# Patient Record
Sex: Female | Born: 1990 | Race: Black or African American | Hispanic: No | Marital: Single | Smoking: Never smoker
Health system: Southern US, Community
[De-identification: ages and names within clinical notes are randomized; demographics above are authoritative.]

## PROBLEM LIST (undated history)

## (undated) ENCOUNTER — Inpatient Hospital Stay (HOSPITAL_COMMUNITY): Payer: Self-pay

## (undated) DIAGNOSIS — Z87440 Personal history of urinary (tract) infections: Secondary | ICD-10-CM

## (undated) DIAGNOSIS — G5600 Carpal tunnel syndrome, unspecified upper limb: Secondary | ICD-10-CM

## (undated) DIAGNOSIS — D573 Sickle-cell trait: Secondary | ICD-10-CM

## (undated) DIAGNOSIS — K7689 Other specified diseases of liver: Secondary | ICD-10-CM

## (undated) DIAGNOSIS — G43909 Migraine, unspecified, not intractable, without status migrainosus: Secondary | ICD-10-CM

## (undated) DIAGNOSIS — Z87448 Personal history of other diseases of urinary system: Secondary | ICD-10-CM

## (undated) DIAGNOSIS — Z8619 Personal history of other infectious and parasitic diseases: Secondary | ICD-10-CM

## (undated) DIAGNOSIS — K862 Cyst of pancreas: Secondary | ICD-10-CM

## (undated) HISTORY — PX: NO PAST SURGERIES: SHX2092

## (undated) HISTORY — PX: OTHER SURGICAL HISTORY: SHX169

## (undated) HISTORY — PX: WISDOM TOOTH EXTRACTION: SHX21

## (undated) HISTORY — DX: Migraine, unspecified, not intractable, without status migrainosus: G43.909

## (undated) HISTORY — DX: Other specified diseases of liver: K76.89

## (undated) HISTORY — DX: Personal history of urinary (tract) infections: Z87.440

## (undated) HISTORY — DX: Cyst of pancreas: K86.2

## (undated) HISTORY — DX: Personal history of other infectious and parasitic diseases: Z86.19

## (undated) HISTORY — DX: Personal history of other diseases of urinary system: Z87.448

---
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
## 2010-02-07 ENCOUNTER — Emergency Department (HOSPITAL_COMMUNITY): Admission: EM | Admit: 2010-02-07 | Discharge: 2010-02-07 | Payer: Self-pay | Admitting: Emergency Medicine

## 2010-02-15 ENCOUNTER — Emergency Department (HOSPITAL_COMMUNITY): Admission: EM | Admit: 2010-02-15 | Discharge: 2010-02-15 | Payer: Self-pay | Admitting: Emergency Medicine

## 2010-05-12 ENCOUNTER — Emergency Department (HOSPITAL_COMMUNITY): Admission: EM | Admit: 2010-05-12 | Discharge: 2010-05-13 | Payer: Self-pay | Admitting: Emergency Medicine

## 2010-11-07 ENCOUNTER — Emergency Department (HOSPITAL_COMMUNITY)
Admission: EM | Admit: 2010-11-07 | Discharge: 2010-11-08 | Payer: Self-pay | Source: Home / Self Care | Admitting: Emergency Medicine

## 2011-02-15 LAB — CBC
HCT: 40.5 % (ref 36.0–46.0)
Hemoglobin: 13.7 g/dL (ref 12.0–15.0)
MCHC: 33.9 g/dL (ref 30.0–36.0)
MCV: 86.4 fL (ref 78.0–100.0)
Platelets: 239 10*3/uL (ref 150–400)
RBC: 4.69 MIL/uL (ref 3.87–5.11)
RDW: 14.2 % (ref 11.5–15.5)
WBC: 13 10*3/uL — ABNORMAL HIGH (ref 4.0–10.5)

## 2011-02-15 LAB — DIFFERENTIAL
Basophils Absolute: 0.1 10*3/uL (ref 0.0–0.1)
Basophils Relative: 1 % (ref 0–1)
Eosinophils Absolute: 0.1 10*3/uL (ref 0.0–0.7)
Eosinophils Relative: 1 % (ref 0–5)
Lymphocytes Relative: 26 % (ref 12–46)
Lymphs Abs: 3.4 10*3/uL (ref 0.7–4.0)
Monocytes Absolute: 0.9 10*3/uL (ref 0.1–1.0)
Monocytes Relative: 7 % (ref 3–12)
Neutro Abs: 8.5 10*3/uL — ABNORMAL HIGH (ref 1.7–7.7)
Neutrophils Relative %: 65 % (ref 43–77)

## 2011-03-04 LAB — URINALYSIS, ROUTINE W REFLEX MICROSCOPIC
Bilirubin Urine: NEGATIVE
Glucose, UA: NEGATIVE mg/dL
Ketones, ur: NEGATIVE mg/dL
Nitrite: NEGATIVE
Protein, ur: 30 mg/dL — AB
Specific Gravity, Urine: 1.015 (ref 1.005–1.030)
Urobilinogen, UA: 0.2 mg/dL (ref 0.0–1.0)
pH: 6 (ref 5.0–8.0)

## 2011-03-04 LAB — WET PREP, GENITAL
Trich, Wet Prep: NONE SEEN
WBC, Wet Prep HPF POC: NONE SEEN
Yeast Wet Prep HPF POC: NONE SEEN

## 2011-03-04 LAB — GC/CHLAMYDIA PROBE AMP, GENITAL
Chlamydia, DNA Probe: POSITIVE — AB
GC Probe Amp, Genital: NEGATIVE

## 2011-03-04 LAB — POCT PREGNANCY, URINE: Preg Test, Ur: NEGATIVE

## 2011-03-04 LAB — URINE MICROSCOPIC-ADD ON

## 2011-03-08 LAB — CULTURE, ROUTINE-ABSCESS

## 2011-03-08 LAB — DIFFERENTIAL
Basophils Absolute: 0.1 10*3/uL (ref 0.0–0.1)
Basophils Relative: 0 % (ref 0–1)
Eosinophils Absolute: 0.1 10*3/uL (ref 0.0–0.7)
Eosinophils Relative: 1 % (ref 0–5)
Lymphocytes Relative: 11 % — ABNORMAL LOW (ref 12–46)
Lymphs Abs: 1.7 10*3/uL (ref 0.7–4.0)
Monocytes Absolute: 1.6 10*3/uL — ABNORMAL HIGH (ref 0.1–1.0)
Monocytes Relative: 10 % (ref 3–12)
Neutro Abs: 12 10*3/uL — ABNORMAL HIGH (ref 1.7–7.7)
Neutrophils Relative %: 78 % — ABNORMAL HIGH (ref 43–77)

## 2011-03-08 LAB — CBC
HCT: 34.9 % — ABNORMAL LOW (ref 36.0–46.0)
HCT: 35.5 % — ABNORMAL LOW (ref 36.0–46.0)
HCT: 37.8 % (ref 36.0–46.0)
HCT: 38.8 % (ref 36.0–46.0)
Hemoglobin: 11.9 g/dL — ABNORMAL LOW (ref 12.0–15.0)
Hemoglobin: 12 g/dL (ref 12.0–15.0)
Hemoglobin: 12.9 g/dL (ref 12.0–15.0)
Hemoglobin: 13.2 g/dL (ref 12.0–15.0)
MCHC: 33.7 g/dL (ref 30.0–36.0)
MCHC: 34 g/dL (ref 30.0–36.0)
MCHC: 34 g/dL (ref 30.0–36.0)
MCHC: 34.2 g/dL (ref 30.0–36.0)
MCV: 84.4 fL (ref 78.0–100.0)
MCV: 84.7 fL (ref 78.0–100.0)
MCV: 84.9 fL (ref 78.0–100.0)
MCV: 85.2 fL (ref 78.0–100.0)
Platelets: 229 10*3/uL (ref 150–400)
Platelets: 251 10*3/uL (ref 150–400)
Platelets: 277 10*3/uL (ref 150–400)
RBC: 4.1 MIL/uL (ref 3.87–5.11)
RBC: 4.19 MIL/uL (ref 3.87–5.11)
RBC: 4.47 MIL/uL (ref 3.87–5.11)
RBC: 4.57 MIL/uL (ref 3.87–5.11)
RDW: 13.9 % (ref 11.5–15.5)
RDW: 14.3 % (ref 11.5–15.5)
RDW: 14.3 % (ref 11.5–15.5)
WBC: 10.5 10*3/uL (ref 4.0–10.5)
WBC: 15.4 10*3/uL — ABNORMAL HIGH (ref 4.0–10.5)
WBC: 16.4 10*3/uL — ABNORMAL HIGH (ref 4.0–10.5)
WBC: 9 10*3/uL (ref 4.0–10.5)

## 2011-03-08 LAB — BASIC METABOLIC PANEL
BUN: 3 mg/dL — ABNORMAL LOW (ref 6–23)
BUN: 3 mg/dL — ABNORMAL LOW (ref 6–23)
CO2: 21 mEq/L (ref 19–32)
CO2: 22 mEq/L (ref 19–32)
CO2: 25 mEq/L (ref 19–32)
Calcium: 8.9 mg/dL (ref 8.4–10.5)
Calcium: 8.9 mg/dL (ref 8.4–10.5)
Chloride: 105 mEq/L (ref 96–112)
Chloride: 106 mEq/L (ref 96–112)
Chloride: 107 mEq/L (ref 96–112)
Creatinine, Ser: 0.66 mg/dL (ref 0.4–1.2)
Creatinine, Ser: 0.78 mg/dL (ref 0.4–1.2)
GFR calc Af Amer: >60 mL/min (ref >60)
GFR calc Af Amer: >60 mL/min (ref >60)
GFR calc Af Amer: >60 mL/min (ref >60)
GFR calc non Af Amer: >60 mL/min (ref >60)
GFR calc non Af Amer: >60 mL/min (ref >60)
Glucose, Bld: 110 mg/dL — ABNORMAL HIGH (ref 70–99)
Glucose, Bld: 94 mg/dL (ref 70–99)
Potassium: 3.5 mEq/L (ref 3.5–5.1)
Potassium: 4 mEq/L (ref 3.5–5.1)
Potassium: 4.2 mEq/L (ref 3.5–5.1)
Sodium: 134 mEq/L — ABNORMAL LOW (ref 135–145)
Sodium: 136 mEq/L (ref 135–145)

## 2011-03-08 LAB — CULTURE, BLOOD (ROUTINE X 2)
Culture: NO GROWTH
Culture: NO GROWTH

## 2011-03-08 LAB — ANAEROBIC CULTURE

## 2011-03-08 LAB — TSH: TSH: 0.823 u[IU]/mL (ref 0.350–4.500)

## 2011-03-08 LAB — COMPREHENSIVE METABOLIC PANEL
ALT: 14 U/L (ref 0–35)
AST: 21 U/L (ref 0–37)
Albumin: 2.6 g/dL — ABNORMAL LOW (ref 3.5–5.2)
CO2: 23 mEq/L (ref 19–32)
Calcium: 8.4 mg/dL (ref 8.4–10.5)
Chloride: 109 mEq/L (ref 96–112)
Creatinine, Ser: 0.67 mg/dL (ref 0.4–1.2)
GFR calc Af Amer: >60 mL/min (ref >60)
Sodium: 137 mEq/L (ref 135–145)

## 2011-03-15 LAB — CULTURE, ROUTINE-ABSCESS

## 2011-03-15 LAB — POCT I-STAT 7, (LYTES, BLD GAS, ICA,H+H)
Acid-Base Excess: 8 mmol/L — ABNORMAL HIGH (ref 0.0–2.0)
Bicarbonate: 33.3 mEq/L — ABNORMAL HIGH (ref 20.0–24.0)
HCT: 34 % — ABNORMAL LOW (ref 36.0–49.0)
Potassium: 4.4 mEq/L (ref 3.5–5.1)
Sodium: 139 mEq/L (ref 135–145)
TCO2: 35 mmol/L (ref 0–100)

## 2011-03-15 LAB — DIFFERENTIAL
Basophils Relative: 0 % (ref 0–1)
Eosinophils Absolute: 0.1 10*3/uL (ref 0.0–1.2)
Eosinophils Relative: 1 % (ref 0–5)
Lymphs Abs: 1.2 10*3/uL (ref 1.1–4.8)

## 2011-03-15 LAB — CBC
HCT: 39.5 % (ref 36.0–49.0)
MCHC: 32.8 g/dL (ref 31.0–37.0)
MCV: 85.1 fL (ref 78.0–98.0)
Platelets: 260 10*3/uL (ref 150–400)
WBC: 13.2 10*3/uL (ref 4.5–13.5)

## 2011-03-15 LAB — GLUCOSE, CAPILLARY: Glucose-Capillary: 111 mg/dL — ABNORMAL HIGH (ref 70–99)

## 2011-03-15 LAB — CULTURE, BLOOD (ROUTINE X 2)

## 2011-04-06 ENCOUNTER — Emergency Department (HOSPITAL_COMMUNITY)
Admission: EM | Admit: 2011-04-06 | Discharge: 2011-04-07 | Disposition: A | Discharge status: Home / Self Care | Payer: Medicaid Other | Attending: Emergency Medicine | Admitting: Emergency Medicine

## 2011-04-06 DIAGNOSIS — S93609A Unspecified sprain of unspecified foot, initial encounter: Secondary | ICD-10-CM | POA: Insufficient documentation

## 2011-04-06 DIAGNOSIS — D573 Sickle-cell trait: Secondary | ICD-10-CM | POA: Insufficient documentation

## 2011-04-06 DIAGNOSIS — M79609 Pain in unspecified limb: Secondary | ICD-10-CM | POA: Insufficient documentation

## 2011-04-06 DIAGNOSIS — X58XXXA Exposure to other specified factors, initial encounter: Secondary | ICD-10-CM | POA: Insufficient documentation

## 2011-04-13 NOTE — Consult Note (Signed)
NAMEKENSLEE, ACHORN NO.:  [id number]   MEDICAL RECORD NO.:  [id number]          PATIENT TYPE:  INP   LOCATION:  6729                         FACILITY:  [facility]   PHYSICIAN:  Maisie Fus A. Cornett, M.D.DATE OF BIRTH:  02-14-1991   DATE OF CONSULTATION:  05/17/2009  DATE OF DISCHARGE:                                 CONSULTATION   PHYSICIAN REQUESTING CONSULTATION:  Dr. Lovell Sheehan.   REASON FOR CONSULTATION:  Right hip and right abdominal wall abscess.   HISTORY OF PRESENT ILLNESS:  The patient is a 20 year old female with a  past history of MRSA, cellulitis, and abscess status post drainage of  the left thigh back in this January of 2010.  She is morbidly obese.  She presents with 2-day history of swelling involving the right hip and  right lower abdominal wall under the anterior superior iliac spine x2  days.  She was placed on a few antibiotics by an outside physician but  came to the hospital since it did not seem to help.  I was asked to see  her at the request of Dr. Lovell Sheehan due to an abscess, took a CT scan  since ultrasound of the area did not show abscess by the emergency room  doctor.  Denies any fever or chills.  She complains of pain over the  area especially with palpation.   PAST MEDICAL HISTORY:  1. Morbid obesity.  2. MRSA infection in the past.  3. Migraines.   PAST SURGICAL HISTORY:  Incision and drainage of previous abscesses.   SOCIAL HISTORY:  She is a Consulting civil engineer.  Denies tobacco or alcohol use.   ALLERGIES:  No known drug allergies.   FAMILY HISTORY:  Noncontributory.   REVIEW OF SYSTEMS:  Positive for right flank discomfort especially just  below her right anterior superior iliac spine, redness and fluctuance.  Otherwise 15-point review of systems negative.   PHYSICAL EXAMINATION:  GENERAL APPEARANCE:  Pleasant female, in no  apparent distress.  HEENT:  No evidence of scleral icterus.  Oropharynx is clear.  SKIN:  Just below her right  anterior superior iliac spine, there is an  area of induration measuring about 20 cm.  Centrally, there is an area  of fluctuance noted on exam.  There is some small micro abscesses of the  skin noted there.  There is no crepitus.   DIAGNOSTIC STUDIES:  White count 15,400, hemoglobin 12.9, sodium 136,  potassium 3.5, chloride 107, CO2 of 22, creatinine is 0.7, and glucose  110.   IMPRESSION:  Right abdominal wall, right hip superficial abscess, noted  by CT with no deep extension with history of methicillin-resistant  Staphylococcus aureus.   PLAN:  She will be made n.p.o. and we will make arrangements for her to  go to the operating room later on this morning for incision and drainage  under general anesthesia.  This will need to be packed.  She will also  continue IV antibiotics as p.o. antibiotics have been ineffective.  It  is being managed by the medical service.      Thomas A. Cornett, M.D.  Electronically Signed  TAC/MEDQ  D:  05/17/2009  T:  05/17/2009  Job:  [id number]

## 2011-04-13 NOTE — Consult Note (Signed)
Anne Ray, NIER NO.:  [id number]   MEDICAL RECORD NO.:  [id number]          PATIENT TYPE:  INP   LOCATION:  6153                         FACILITY:  [facility]   PHYSICIAN:  Juanetta Gosling, MDDATE OF BIRTH:  18-Mar-1991   DATE OF CONSULTATION:  12/12/2008  DATE OF DISCHARGE:                                 CONSULTATION   REQUESTING PHYSICIAN:  Henrietta Hoover, M.D.   REASON FOR CONSULTATION:  Evaluation of draining abscess, left thigh.   HISTORY OF PRESENT ILLNESS:  Anne Ray is a 20 year old, otherwise  relatively healthy female patient who presents with her first episode of  MRSA abscess.  The patient reports onset of this area on December 06, 2008, started as a small pimple area very tender rapidly increased in  size.  She was seen in Urgent Care.  Within 24 hours, this area was I  and D'd, and the patient continued to have swelling and pain, so she  presented to the [facility] ER where she was admitted by the Pediatric  Service.  Cultures were obtained and these did show MRSA sensitive to  clindamycin.  She has been placed on p.o. clindamycin with apparent  improvement in her symptoms.  Her white count on December 07, 2008, was  13,000.  An MRI was done because of persistent residual edema and pain.  This was done on December 11, 2008, that showed no evidence of residual  abscess or fluid collection.  No myositis or fasciitis, just a very  large phlegmon in the muscle tissue.  She did have lymph node  enlargement.  The patient has had purulent to serous drainage since  admission and the packing that was placed has fallen out.  She also has  edema of the thigh region, but the patient reports this has markedly  decreased since prior to admission.   REVIEW OF SYSTEMS:  CONSTITUTIONAL:  The patient reports that her pain  and otherwise overall symptoms related to the leg are much better than  prior to admission.  She is still having some areas of significant pain  in the area adjacent to the wound that correlates with an enlarged lymph  node.   FAMILY MEDICAL HISTORY:  Noncontributory.   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   ALLERGIES:  NKDA.   MEDICATIONS:  While hospitalized include MiraLax, clindamycin, Colace,  Zofran, ibuprofen, Tylenol, and oxycodone p.r.n.   PAST MEDICAL HISTORY:  1. Migraines.  2. Obesity.   PAST SURGICAL HISTORY:  None.   PHYSICAL EXAMINATION:  GENERAL:  Pleasant female patient complaining of  continued pain at the left I and D abscess site.  VITAL SIGNS:  Temperature 97, BP 120/69, pulse 82 and regular, and  respirations 18.  PSYCH:  The patient is alert and oriented x3.  Affect is appropriate to  current situation.  NEURO:  Cranial nerves II-XII are grossly intact.  She is moving all  extremities x4 without focal neurological deficits.  CHEST:  Bilateral lung sounds are clear to auscultation anteriorly.  Respiratory effort is nonlabored.  She is on  room air.  CARDIOVASCULAR:  Heart sounds S1 and S2.  No rubs, murmurs, thrills, or  gallops.  No JVD.  No peripheral edema.  Pulse is regular and  nontachycardic.  ABDOMEN:  Soft, nontender, nondistended, and obese.  EXTREMITIES:  Symmetrical in appearance except for the previously  mentioned edema of the left thigh.  This edema is notable, but is not  tight.  SKIN:  There is a 1-cm diameter I and D procedure wound on the anterior  portion of the thigh.  This is draining serous fluid.  There is no  packing in place at the present time.  This was removed this morning.  The peri-wound tissue is essentially soft with only slight soft tissue  induration beneath.  As you move away from the I and D site, there is  subtle increase in induration.  There is a palpable lymph node at about  9 o'clock, 4 cm away from an I and D site.  This lymph node is quite  tender.  The lymph node is firm and nonmobile.   LABORATORY DATA:   Blood cultures x1 are pending.  Wound culture again  showed MRSA with a clindamycin sensitivity, MIC being 0.25.  Last CBC  was checked on December 07, 2008.  White count was 13,200, hemoglobin  12.9, platelets 260,000, and neutrophils 79%.   DIAGNOSTICS:  Left hip and lower extremity MRI was done on December 11, 2008, again demonstrates subcu soft tissue infection with associated  edema without fasciitis or myositis, but there is a large phlegmon  without any definite fluid collection or residual abscess.  There is  also lymphadenopathy in the inguinal regions.   IMPRESSION:  Draining methicillin-resistant Staphylococcus aureus  abscesses, status post incision and drainage, stable.   PLAN:  1. We will check a CBC.  If the white count is actually increased,      would favor possibly changing over to IV vancomycin to ensure      adequate treatment of acute issues.  Otherwise, would continue      clindamycin.  2. Begin hydrotherapy with physical therapy at least x1 treatment to      determine if there is any residual debris in the abscess tract that      could be pulled out especially given her degree of edema.  3. We will go ahead and start normal saline packing b.i.d. with      quarter-inch Nu-Gauze strips.  Use a Q-tip to pack it deeply if      possible.  The patient will probably need to continue this after      discharge to ensure that the abscess tract stays open and all the      edema can drain out through the abscess tract to improve wound      healing.  I do not think there is any other area that is need of drainage.  The  area has decreased significantly and appears to have only residual  induration.      Allison L. Marya Landry, MD  Electronically Signed    ALE/MEDQ  D:  12/12/2008  T:  12/13/2008  Job:  [id number]   cc:   Henrietta Hoover, MD

## 2011-04-13 NOTE — Discharge Summary (Signed)
NAMEKEONIA, PASKO NO.:  [id number]   MEDICAL RECORD NO.:  [id number]          PATIENT TYPE:  INP   LOCATION:  6729                         FACILITY:  [facility]   PHYSICIAN:  Beckey Rutter, MD  DATE OF BIRTH:  November 03, 1991   DATE OF ADMISSION:  05/16/2009  DATE OF DISCHARGE:  05/19/2009                               DISCHARGE SUMMARY   PRIMARY CARE PHYSICIAN:  Unassigned.   BRIEF HISTORY OF PRESENT ILLNESS AND HOSPITAL COURSE:  1. Worsening right hip area abscess.  This is 20 years old presents      with above-mentioned complaint.  The patient has I and D done by      General Surgery.  The patient was found to have MRSA.  Today, she      is stable for discharge to continue on 10-14 days of doxycycline as      prescribed.  She is aware and agreeable to discharge plan.  2. Obesity.  The patient counseled.   DISCHARGE DIAGNOSES:  1. Right iliac crest abscess with cellulitis.  2. Leukocytosis.  3. Sickle cell trait.  4. Obesity.  5. Methicillin-resistant Staphylococcus aureus from the abscess.   DISCHARGE MEDICATION:  1. Doxycycline 100 mg p.o. b.i.d.  2. Sarna Lotion.   DISCHARGE/PLAN:  The patient was advised to follow up with [facility] within 2 weeks.   HOSPITAL CONSULTATION:  Surgical Service provided by [facility].  The patient to see Dr. Corliss Skains.      Beckey Rutter, MD  Electronically Signed     EME/MEDQ  D:  05/19/2009  T:  05/20/2009  Job:  [id number]

## 2011-04-13 NOTE — Op Note (Signed)
NAMEAMARIANA, [name] NO.:  [id number]   MEDICAL RECORD NO.:  [id number]          PATIENT TYPE:  INP   LOCATION:  6729                         FACILITY:  [facility]   PHYSICIAN:  Wilmon Arms. Corliss Skains, M.D. DATE OF BIRTH:  Aug 25, 1991   DATE OF PROCEDURE:  05/17/2009  DATE OF DISCHARGE:                               OPERATIVE REPORT   PREOPERATIVE DIAGNOSIS:  Abscess of the soft tissues of the right hip  and thigh.   POSTOPERATIVE DIAGNOSIS:  Abscess of the soft tissues of the right hip  and thigh.   PROCEDURE PERFORMED:  Incision and drainage, and debridement a right  hip/thigh abscess.   SURGEON:  Wilmon Arms. Tsuei, MD   ANESTHESIA:  General.   INDICATIONS:  This is an 20 year old female who is morbidly obese with a  past medical history of MRSA infections who presents with a 2-day  history of right hip pain, induration, swelling.  She has a 20-cm area  of induration.  A CT scan showed a loculated abscess superficial to her  anterior superior iliac spine.  The abscess has begun spontaneously  draining through a small pinhole.  She was brought to the operating room  for formal I and D.   DESCRIPTION OF PROCEDURE:  The patient was brought to the operating  room, placed in supine position on the operating room table.  After an  adequate level of general anesthesia was obtained, her right hip and  thigh were prepped with Betadine and draped in sterile fashion.  A time-  out was taken to assure the proper patient, proper procedure.  I  inserted a hemostat into the pinhole opening and opened it widely.  A  large amount of purulent fluid was expressed.  We cultured this and sent  it to Microbiology.  I then created a circular opening over this area  large enough to allow the passage of a finger.  We suctioned out as much  purulent fluid as possible.  I then explored the wound with a finger.  This seemed to track laterally and medially, but not superiorly or  inferiorly.   There were several loculations were broken up with a  finger.  This seemed to track anterior on the thigh, so I made an  elongated oval incision to expose the entire abscess cavity.  We  debrided to allow the necrotic tissue.  Pulse lavage was then used with  3 L of saline to thoroughly irrigate the entire abscess cavity.  We then  used cautery for hemostasis.  The wound was packed with saline-soaked  Kerlix.  An ABD dressing was used to cover the wound.  The patient was  extubated, brought to recovery in stable condition.  All sponge,  instrument, and needle counts were correct.      Wilmon Arms. Tsuei, M.D.  Electronically Signed     MKT/MEDQ  D:  05/17/2009  T:  05/18/2009  Job:  [id number]

## 2011-04-13 NOTE — H&P (Signed)
NAMECHRISTELLA, APP NO.:  [id number]   MEDICAL RECORD NO.:  [id number]          PATIENT TYPE:  INP   LOCATION:  6729                         FACILITY:  [facility]   PHYSICIAN:  Della Goo, M.D. DATE OF BIRTH:  [birth year]   DATE OF ADMISSION:  05/16/2009  DATE OF DISCHARGE:                              HISTORY & PHYSICAL   PRIMARY CARE PHYSICIAN:  [facility].   CHIEF COMPLAINT:  Worsening right hip area abscess.   HISTORY OF PRESENT ILLNESS:  This is a 20 year old female who presents  to the emergency department with complaints of worsening of an abscess  above her right hip area.  The patient reports this pain, swelling and  redness began 1 week ago.  She states that she thought it was a mosquito  bite and she was told initially that it may have been an allergic  reaction to the mosquito bite.  The patient reports being seen by her  primary care physician and placed on antibiotic therapy of Bactrim 4  days ago.  However, the area continued to increase in size and pain and  redness.  The patient denies having any drainage from the area.  The  patient denies having any fevers, chills, however was found to have a  temperature of 100.5 at the initial intake.   PAST MEDICAL HISTORY:  Significant for sickle cell trait, migraine  headaches and Depo-Provera q. 3 months.   ALLERGIES:  No known drug allergies.   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  She is a  nonsmoker, nondrinker, and no history of illicit drug usage.   FAMILY HISTORY:  Positive for diabetes in her maternal grandmother.  No  history of hypertension, coronary artery disease or cancer in her family  that she knows of.   REVIEW OF SYSTEMS:  Other than what is mentioned above, the patient  denies having any nausea, vomiting, diarrhea, fever, chills, chest pain,  shortness of breath, syncope, seizures, weight gain, weight loss, loss  of appetite, arthralgias,  dysuria.   PHYSICAL EXAMINATION FINDINGS:  This is a 20 year old obese female in  discomfort but no acute distress.  Her temperature is 100.5, blood pressure 125/72, heart rate 118,  respirations 18, O2 saturation is 99-100%.  HEENT EXAMINATION:  Normocephalic, atraumatic.  There is no scleral  icterus.  Pupils equally round, reactive to light.  Extraocular  movements are intact.  Funduscopic benign.  Nares are patent  bilaterally.  Oropharynx is clear.  NECK:  Supple, full range of motion.  No thyromegaly, adenopathy or  jugulovenous distention.  CARDIOVASCULAR:  Tachycardiac rate and rhythm.  No murmurs, gallops or  rubs.  LUNGS:  Clear to auscultation bilaterally.  No rales, rhonchi or wheezes  appreciated.  ABDOMEN:  Positive bowel sounds, soft, nontender, nondistended.  SKIN EXAMINATION:  The patient has a large, oval-shaped indurated area  which has been measured and is found to have dimensions of 25 x 14 cm.  There is a central area which appears to be coming to a head.  There is  no punctum at this area.  This  area is located at the right superior  iliac crest area.  EXTREMITIES:  Without cyanosis, clubbing or edema.  NEUROLOGIC EXAMINATION:  The patient is alert and oriented x3.  Cranial  nerves are intact.  Motor and sensory function intact.  Gait is intact  and deep tendon reflexes are also intact.  Cerebellar function intact.   LABORATORY STUDIES:  White blood cell count 15.4, hemoglobin 12.9,  hematocrit 37.8, MCV 84.4, platelets 251.  Neutrophils 78%, lymphocytes  11%.  Sodium 136, potassium 3.5, chloride 107, carbon dioxide 22, BUN 3,  creatinine 0.78, glucose 110, calcium 8.9.  CT scan of the abdomen and  pelvis reveals cellulitis in the subcutaneous tissues of the right side  of the abdomen laterally just above the right superior iliac crest, and  the CT portion of the pelvis reveals an abscess in the subcutaneous  tissues above the right hip at approximately the  level of the right  superior iliac crest.  The irregular abscess has dimensions about 5 cm  in diameter, but there are several small pockets of pus with overlying  cellulitis.  There is no deep extension into the muscles or into the  bony structures of the hip joint.   ASSESSMENT:  An 20 year old female being admitted with:  1. Abscess of the right iliac crest with overlying cellulitis.  2. Leukocytosis.  3. Febrile illness/early sepsis.  4. Sickle cell trait.  5. Obesity.   PLAN:  The patient will be admitted and placed on IV antibiotic therapy  of vancomycin and Unasyn.  The patient will be placed on MRSA  precautions.  Pain control therapy has also been ordered.  A general  surgery consultation has been placed with Dr. Luisa Hart, who will see the  patient in the a.m. for a possible incision and drainage.  DVT and GI  prophylaxis have also been ordered.      Della Goo, M.D.  Electronically Signed     HJ/MEDQ  D:  05/17/2009  T:  05/17/2009  Job:  [id number]

## 2011-04-13 NOTE — Discharge Summary (Signed)
NAMEWELDA, [name]:  [id number]   MEDICAL RECORD NO.:  [id number]          PATIENT TYPE:  INP   LOCATION:  6153                         FACILITY:  [facility]   PHYSICIAN:  Orie Rout, M.D.DATE OF BIRTH:  09-09-1991   DATE OF ADMISSION:  12/07/2008  DATE OF DISCHARGE:  12/13/2008                               DISCHARGE SUMMARY   SIGNIFICANT FINDINGS:  This is a 20 year old female who presents with  left lateral thigh cellulitis, status post Incision and Drainage at the  Urgent Care Center on December 06, 2008, and sent home with Septra.  The  patient became nauseous with the medication and Septra was switched to  doxycycline.  On admission, the patient was afebrile.  CBC with white  blood cell count of 13.2k, hemoglobin 12.9gm/dL, hematocrit 16.1%,  platelet 260k with 79% neutrophils and an ANC of 10.4k.  Culture of the  abscess was obtained which grew CA-MRSA.  Blood culture was obtained  which has been no growth to date.  The patient was started on  clindamycin IV at  admission.  When the patient had remained afebrile on  clindamycin IV she was transitioned to oral clindamycin. And again she  has been afebrile from admission to day of discharge.  An MRI was  ordered on December 11, 2008, to rule out pyomyositis and fasciitis.  The  MRI showed subcutaneous tissue infection without fasciitis or myositis  or osteomyelitis.  General Surgery was then consulted to evaluate the  patient for possible I and D.  Surgery saw the patient and recommended  that she has hydrofusion therapy daily with normal saline packing.  Surgery also recommended wound care and home health care.  Case manager  has been able to set up home health for patient for wound care with  daily packing.  Surgery was again contacted for followup care and  Surgery has decided that the patient does not need to follow up with  them since they were not the one who did the original I and D.  Since  the  patient has remained afebrile during this hospitalization and her  abscess has been draining and decreasing in size the decision was made  the patient was stable to be discharged.  The patient is discharged on  December 13, 2008, to continue with oral clindamycin for a total of 10  days on clindamycin.   During this hospitalization, the patient also complained of leg numbness  that has been present for years.  Since the patient had a family history  of diabetes, we were concerned that her numbness may be due to diabetes  and we obtained random CBGs, which were 104 and 111.  Diabetes was ruled  out, but patient is obese and may be prediabetic.  She should follow-up  with her PCP regarding obesity.  Nutrition was also consulted to offer  patient healthy food options but she expressed that she was not  interested in losing weight.   TREATMENT:  1. Clindamycin IV q.8 h.  2. Tylenol with codeine for pain.  3. Oxycodone 5 mg q.6 h. p.r.n. pain.  4. Clindamycin p.o.  OPERATIONS AND PROCEDURES:  MRI on December 11, 2008, which showed left  thigh subcutaneous soft tissue infection, negative osteomyelitis,  negative fasciitis, and negative myositis.   FINAL DIAGNOSES:  1. Methicillin-resistant Staphylococcus aureus cellulitis/abscess to      Left lateral thigh.  2. Obesity.   DISCHARGE MEDICATIONS:  1. Clindamycin 600 mg p.o. q.8 h. x5 days.  2. Oxycodone 5 mg p.o. q.6 h. p.r.n. severe pain x10 tablets.   PENDING RESULTS TO BE FOLLOWED UP:  Blood culture.   FOLLOWUP VISIT:  The patient has a followup visit with [facility], [place], which is her PCPs office, phone number [phone].  The patient has an appointment with Galvin Proffer, PCP, on December 19, 2008, at 11 o'clock.  Patient to have home health visits to the house  for wound care.   DISCHARGE WEIGHT:  138 kg.   DISCHARGE CONDITION:  Stable.   Fax to primary care physician at [phone].      Angeline Slim, MD   Electronically Signed      Orie Rout, M.D.  Electronically Signed    CT/MEDQ  D:  12/13/2008  T:  12/14/2008  Job:  [id number]

## 2011-05-07 ENCOUNTER — Emergency Department (HOSPITAL_COMMUNITY)
Admission: EM | Admit: 2011-05-07 | Discharge: 2011-05-08 | Disposition: A | Discharge status: Home / Self Care | Payer: Medicaid Other | Attending: Emergency Medicine | Admitting: Emergency Medicine

## 2011-05-07 DIAGNOSIS — L02419 Cutaneous abscess of limb, unspecified: Secondary | ICD-10-CM | POA: Insufficient documentation

## 2011-05-07 DIAGNOSIS — L989 Disorder of the skin and subcutaneous tissue, unspecified: Secondary | ICD-10-CM | POA: Insufficient documentation

## 2011-05-07 DIAGNOSIS — R000 Tachycardia, unspecified: Secondary | ICD-10-CM | POA: Insufficient documentation

## 2011-05-07 DIAGNOSIS — D573 Sickle-cell trait: Secondary | ICD-10-CM | POA: Insufficient documentation

## 2011-05-07 DIAGNOSIS — M79609 Pain in unspecified limb: Secondary | ICD-10-CM | POA: Insufficient documentation

## 2011-05-07 DIAGNOSIS — M7989 Other specified soft tissue disorders: Secondary | ICD-10-CM | POA: Insufficient documentation

## 2011-05-07 DIAGNOSIS — E669 Obesity, unspecified: Secondary | ICD-10-CM | POA: Insufficient documentation

## 2011-05-09 ENCOUNTER — Emergency Department (HOSPITAL_COMMUNITY)
Admission: EM | Admit: 2011-05-09 | Discharge: 2011-05-10 | Disposition: A | Discharge status: Home / Self Care | Payer: Medicaid Other | Attending: Emergency Medicine | Admitting: Emergency Medicine

## 2011-05-09 DIAGNOSIS — L02419 Cutaneous abscess of limb, unspecified: Secondary | ICD-10-CM | POA: Insufficient documentation

## 2011-05-09 DIAGNOSIS — L03119 Cellulitis of unspecified part of limb: Secondary | ICD-10-CM | POA: Insufficient documentation

## 2011-05-09 DIAGNOSIS — Z4801 Encounter for change or removal of surgical wound dressing: Secondary | ICD-10-CM | POA: Insufficient documentation

## 2011-05-12 ENCOUNTER — Emergency Department (HOSPITAL_COMMUNITY)
Admission: EM | Admit: 2011-05-12 | Discharge: 2011-05-12 | Disposition: A | Discharge status: Home / Self Care | Payer: Medicaid Other | Attending: Emergency Medicine | Admitting: Emergency Medicine

## 2011-05-12 DIAGNOSIS — L02419 Cutaneous abscess of limb, unspecified: Secondary | ICD-10-CM | POA: Insufficient documentation

## 2011-05-12 DIAGNOSIS — L03119 Cellulitis of unspecified part of limb: Secondary | ICD-10-CM | POA: Insufficient documentation

## 2011-05-12 DIAGNOSIS — Z4801 Encounter for change or removal of surgical wound dressing: Secondary | ICD-10-CM | POA: Insufficient documentation

## 2011-05-30 ENCOUNTER — Emergency Department (HOSPITAL_COMMUNITY)
Admission: EM | Admit: 2011-05-30 | Discharge: 2011-05-30 | Disposition: A | Discharge status: Home / Self Care | Payer: Medicaid Other | Attending: Emergency Medicine | Admitting: Emergency Medicine

## 2011-05-30 DIAGNOSIS — L03317 Cellulitis of buttock: Secondary | ICD-10-CM | POA: Insufficient documentation

## 2011-05-30 DIAGNOSIS — L0231 Cutaneous abscess of buttock: Secondary | ICD-10-CM | POA: Insufficient documentation

## 2011-11-09 ENCOUNTER — Emergency Department (HOSPITAL_COMMUNITY): Payer: Medicaid Other

## 2011-11-09 ENCOUNTER — Emergency Department (HOSPITAL_COMMUNITY)
Admission: EM | Admit: 2011-11-09 | Discharge: 2011-11-10 | Disposition: A | Discharge status: Home / Self Care | Payer: Medicaid Other | Attending: Emergency Medicine | Admitting: Emergency Medicine

## 2011-11-09 ENCOUNTER — Encounter: Payer: Self-pay | Admitting: Emergency Medicine

## 2011-11-09 DIAGNOSIS — G5600 Carpal tunnel syndrome, unspecified upper limb: Secondary | ICD-10-CM

## 2011-11-09 DIAGNOSIS — M79609 Pain in unspecified limb: Secondary | ICD-10-CM | POA: Insufficient documentation

## 2011-11-09 HISTORY — DX: Carpal tunnel syndrome, unspecified upper limb: G56.00

## 2011-11-10 MED ORDER — IBUPROFEN 600 MG PO TABS: 600.0000 mg | ORAL_TABLET | Freq: Four times a day (QID) | ORAL | Status: AC | PRN

## 2011-11-10 MED ORDER — IBUPROFEN 800 MG PO TABS: 800.0000 mg | ORAL_TABLET | Freq: Once | ORAL | Status: DC

## 2011-11-10 NOTE — ED Provider Notes (Signed)
History     CSN: [id number] Arrival date & time: 11/09/2011  8:57 PM   First MD Initiated Contact with Patient 11/09/11 2327      Chief Complaint  Patient presents with  . Hand Pain    Left pain hx carp tunnel    (Consider location/radiation/quality/duration/timing/severity/associated sxs/prior treatment) HPI Comments: Patient with carpal tunnel syndrome in the left wrist for while now has started school, where she is taking typing and computer classes.  This has aggravated her situation.  She has not taken any over-the-counter medicines nor immobilize the wrist  Patient is a 20 y.o. female presenting with hand pain. The history is provided by the patient.  Hand Pain This is a recurrent problem. The current episode started 1 to 4 weeks ago. The problem occurs constantly. The problem has been unchanged. Pertinent negatives include no joint swelling. The symptoms are aggravated by exertion. She has tried nothing for the symptoms. The treatment provided no relief.    Past Medical History  Diagnosis Date  . Migraine   . Carpal tunnel syndrome     History reviewed. No pertinent past surgical history.  History reviewed. No pertinent family history.  History  Substance Use Topics  . Smoking status: Never Smoker   . Smokeless tobacco: Not on file  . Alcohol Use: No    OB History    Grav Para Term Preterm Abortions TAB SAB Ect Mult Living                  Review of Systems  Constitutional: Positive for activity change.  HENT: Negative.   Eyes: Negative.   Respiratory: Negative.   Cardiovascular: Negative.   Genitourinary: Negative.   Musculoskeletal: Negative for joint swelling.  Neurological: Negative.   Hematological: Negative.   Psychiatric/Behavioral: Negative.     Allergies  Review of patient's allergies indicates no known allergies.  Home Medications   Current Outpatient Rx  Name Route Sig Dispense Refill  . IBUPROFEN 600 MG PO TABS Oral Take 1 tablet  (600 mg total) by mouth every 6 (six) hours as needed for pain. 30 tablet 0    BP 144/87  Pulse 96  Temp(Src) 99 F (37.2 C) (Oral)  Resp 18  SpO2 99%  Physical Exam  Constitutional: She is oriented to person, place, and time. She appears well-developed.  HENT:  Head: Normocephalic.  Neck: Normal range of motion.  Cardiovascular: Normal rate.   Pulmonary/Chest: Effort normal.  Musculoskeletal:       Right shoulder: She exhibits tenderness.       Left wrist: She exhibits decreased range of motion and tenderness. She exhibits no crepitus, no deformity and no laceration.  Neurological: She is oriented to person, place, and time.  Skin: Skin is warm.    ED Course  Procedures (including critical care time)  Labs Reviewed - No data to display Dg Wrist Complete Left  11/09/2011  *RADIOLOGY REPORT*  Clinical Data: Wrist pain  LEFT WRIST - COMPLETE 3+ VIEW  Comparison: None.  Findings: There is no evidence for acute fracture.  No dislocation. Carpal alignment is intact.  Soft tissues are unremarkable.  IMPRESSION: Normal exam.  Original Report Authenticated By: ERIC A. MANSELL, M.D.     1. Carpal tunnel syndrome       MDM  After exam of this patient with positive Tinel sign.  Will treat for carpal tunnel with immobilization, refer to hand surgery for further evaluation        Cipriano Mile  Manus Rudd, NP 11/10/11 0021  Arman Filter, NP 11/10/11 [phone]

## 2011-11-10 NOTE — ED Notes (Signed)
Pt did not receive wrist splint due to leaving prior to discharge.

## 2011-11-10 NOTE — ED Notes (Signed)
Pt still not found in triage, general wait or wait. Pt left without discharge papers pt did not receive any rx medications.

## 2011-11-10 NOTE — ED Notes (Signed)
Went to check on pt, pt not in room. Pt family not in room. Pt not in triage wait or general wait. Pt not in bathroom.

## 2011-11-24 NOTE — ED Provider Notes (Signed)
Medical screening examination/treatment/procedure(s) were performed by non-physician practitioner and as supervising physician I was immediately available for consultation/collaboration.   Angelik Walls E Bena Kobel, MD 11/24/11 0736 

## 2011-12-15 ENCOUNTER — Emergency Department (HOSPITAL_COMMUNITY)
Admission: EM | Admit: 2011-12-15 | Discharge: 2011-12-16 | Disposition: A | Discharge status: Home / Self Care | Payer: Medicaid Other | Attending: Emergency Medicine | Admitting: Emergency Medicine

## 2011-12-15 ENCOUNTER — Encounter (HOSPITAL_COMMUNITY): Payer: Self-pay

## 2011-12-15 DIAGNOSIS — J329 Chronic sinusitis, unspecified: Secondary | ICD-10-CM

## 2011-12-15 DIAGNOSIS — J40 Bronchitis, not specified as acute or chronic: Secondary | ICD-10-CM | POA: Insufficient documentation

## 2011-12-15 NOTE — ED Notes (Signed)
Pt complains of a sore throat, cough, congestion, no appetite, dehydrated and dizzy since Sunday

## 2011-12-16 MED ORDER — AEROCHAMBER Z-STAT PLUS/MEDIUM MISC
1.0000 | Freq: Once | Status: AC
  Administered 2011-12-16: 1

## 2011-12-16 MED ORDER — AMOXICILLIN 500 MG PO TABS: 1000.0000 mg | ORAL_TABLET | Freq: Two times a day (BID) | ORAL | Status: AC

## 2011-12-16 MED ORDER — ALBUTEROL SULFATE HFA 108 (90 BASE) MCG/ACT IN AERS
2.0000 | INHALATION_SPRAY | RESPIRATORY_TRACT | Status: DC | PRN
  Administered 2011-12-16: 2 via RESPIRATORY_TRACT
  Filled 2011-12-16: qty 6.7

## 2011-12-16 NOTE — ED Provider Notes (Signed)
History     CSN: [id number]  Arrival date & time 12/15/11  2224   None     Chief Complaint  Patient presents with  . URI    (Consider location/radiation/quality/duration/timing/severity/associated sxs/prior treatment) HPI Anne Ray is a 21 y.o. female presents with c/o cough and nasal congestion leading to desire to be assessed in the ED. The sx(s) have been present for 6 days. Additional concerns are SOB with exertion, sinus congestion. Causative factors are "a cold". Palliative factors are nothing. The distress associated is mild. The disorder has been present for 6 days.   Past Medical History  Diagnosis Date  . Migraine   . Carpal tunnel syndrome     History reviewed. No pertinent past surgical history.  History reviewed. No pertinent family history.  History  Substance Use Topics  . Smoking status: Never Smoker   . Smokeless tobacco: Not on file  . Alcohol Use: No    OB History    Grav Para Term Preterm Abortions TAB SAB Ect Mult Living                  Review of Systems  All other systems reviewed and are negative.    Allergies  Review of patient's allergies indicates no known allergies.  Home Medications   Current Outpatient Rx  Name Route Sig Dispense Refill  . AMOXICILLIN 500 MG PO TABS Oral Take 2 tablets (1,000 mg total) by mouth 2 (two) times daily. 28 tablet 0    BP 126/71  Pulse 81  Temp(Src) 98.4 F (36.9 C) (Oral)  Resp 20  SpO2 97%  Physical Exam  Nursing note and vitals reviewed. Constitutional: She is oriented to person, place, and time. She appears well-developed and well-nourished.  HENT:  Head: Normocephalic and atraumatic.  Right Ear: External ear normal.  Left Ear: External ear normal.  Eyes: Conjunctivae and EOM are normal. Pupils are equal, round, and reactive to light.  Neck: Normal range of motion and phonation normal. Neck supple.  Cardiovascular: Normal rate, regular rhythm and intact distal pulses.     Pulmonary/Chest: Effort normal. No respiratory distress. She has wheezes. She has no rales. She exhibits no tenderness.       Prolonged experations  Abdominal: Soft. She exhibits no distension. There is no tenderness. There is no guarding.  Musculoskeletal: Normal range of motion.  Neurological: She is alert and oriented to person, place, and time. She has normal strength and normal reflexes. She exhibits normal muscle tone.  Skin: Skin is warm and dry.  Psychiatric: She has a normal mood and affect. Her behavior is normal. Judgment and thought content normal.    ED Course  Procedures (including critical care time)  Labs Reviewed - No data to display No results found.   1. Bronchitis   2. Sinusitis       MDM  Nontoxic patient with sinus symptoms, and bronchitis. She is stable for discharge        Flint Melter, MD 12/16/11 [phone]

## 2012-08-01 ENCOUNTER — Encounter (HOSPITAL_COMMUNITY): Payer: Self-pay

## 2012-08-01 ENCOUNTER — Emergency Department (HOSPITAL_COMMUNITY)
Admission: EM | Admit: 2012-08-01 | Discharge: 2012-08-01 | Disposition: A | Discharge status: Home / Self Care | Payer: Medicaid Other | Attending: Emergency Medicine | Admitting: Emergency Medicine

## 2012-08-01 DIAGNOSIS — J020 Streptococcal pharyngitis: Secondary | ICD-10-CM | POA: Insufficient documentation

## 2012-08-01 DIAGNOSIS — G5600 Carpal tunnel syndrome, unspecified upper limb: Secondary | ICD-10-CM | POA: Insufficient documentation

## 2012-08-01 MED ORDER — PENICILLIN G BENZATHINE 1200000 UNIT/2ML IM SUSP
1.2000 10*6.[IU] | Freq: Once | INTRAMUSCULAR | Status: AC
  Administered 2012-08-01: 1.2 10*6.[IU] via INTRAMUSCULAR
  Filled 2012-08-01: qty 2

## 2012-08-01 NOTE — ED Provider Notes (Signed)
Medical screening examination/treatment/procedure(s) were performed by non-physician practitioner and as supervising physician I was immediately available for consultation/collaboration.  Malvika Tung R. Flornce Record, MD 08/01/12 1629 

## 2012-08-01 NOTE — ED Notes (Signed)
Pt complains of feeling like there is something in her throat after eating last night, pts throat is red, swollen, with white patches

## 2012-08-01 NOTE — ED Provider Notes (Signed)
History     CSN: [id number]  Arrival date & time 08/01/12  1045   None     Chief Complaint  Patient presents with  . Foreign Body    (Consider location/radiation/quality/duration/timing/severity/associated sxs/prior treatment) HPI Comments: Patient presents with sore throat and painful swallowing that started last night. She reports feeling like "something is stuck in her throat." She admits to cervical lymphadenopathy, hoarseness of voice, and shortness of breath. She denies cough and feeling feverish as well as abdominal pain and chest pain. She has not taken anything for her sore throat.   Patient is a 21 y.o. female presenting with foreign body.  Foreign Body  Associated symptoms include sore throat. Pertinent negatives include no chest pain, no fever, no abdominal pain, no vomiting, no drooling, no trouble swallowing and no cough.    Past Medical History  Diagnosis Date  . Migraine   . Carpal tunnel syndrome     History reviewed. No pertinent past surgical history.  No family history on file.  History  Substance Use Topics  . Smoking status: Never Smoker   . Smokeless tobacco: Not on file  . Alcohol Use: No    OB History    Grav Para Term Preterm Abortions TAB SAB Ect Mult Living                  Review of Systems  Constitutional: Positive for fatigue. Negative for fever and diaphoresis.  HENT: Positive for sore throat and voice change. Negative for drooling, trouble swallowing, neck pain and neck stiffness.   Respiratory: Positive for shortness of breath. Negative for cough.   Cardiovascular: Negative for chest pain.  Gastrointestinal: Negative for nausea, vomiting, abdominal pain and diarrhea.  Skin: Negative for rash and wound.  Neurological: Positive for headaches. Negative for dizziness.    Allergies  Review of patient's allergies indicates no known allergies.  Home Medications  No current outpatient prescriptions on file.  BP 133/60  Pulse 90   Temp 98.8 F (37.1 C)  Resp 18  SpO2 100%  Physical Exam  Nursing note and vitals reviewed. Constitutional: She is oriented to person, place, and time. She appears well-developed and well-nourished. No distress.       Patient having difficulty speaking as her voice sounds hoarse. Patient is not distressed or having trouble breathing.   HENT:  Head: Normocephalic and atraumatic.       Pharynx shows notable tonsillar edema and posterior pharyngeal erythema with exudate.   Eyes: Conjunctivae are normal. No scleral icterus.  Neck: Normal range of motion. Neck supple.  Cardiovascular: Normal rate and regular rhythm.  Exam reveals no gallop and no friction rub.   No murmur heard. Pulmonary/Chest: Effort normal and breath sounds normal. No respiratory distress. She has no wheezes. She has no rales. She exhibits no tenderness.  Musculoskeletal: Normal range of motion.  Lymphadenopathy:    She has cervical adenopathy.  Neurological: She is alert and oriented to person, place, and time.  Skin: Skin is warm and dry. No rash noted. She is not diaphoretic.  Psychiatric: She has a normal mood and affect. Her behavior is normal.    ED Course  Procedures (including critical care time)  Labs Reviewed  RAPID STREP SCREEN - Abnormal; Notable for the following:    Streptococcus, Group A Screen (Direct) POSITIVE (*)     All other components within normal limits   No results found.   No diagnosis found.    MDM  1:18 PM  Patient presents with sore throat, absence of cough, cervical adenopathy. She has a positive rapid strep test. I will treat her for strep throat with 1.2 million units Pen G. She can be discharged with no further evaluation.       Emilia Beck, PA-C 08/01/12 1327

## 2012-08-01 NOTE — ED Notes (Signed)
Pt presents with no acute distress.  Pt reports went out to dinner last night and did not swallow something right- "still feels like it is there"  Pt able to handle secretions and speak clearly.

## 2012-08-03 ENCOUNTER — Encounter (HOSPITAL_COMMUNITY): Payer: Self-pay | Admitting: *Deleted

## 2012-08-03 ENCOUNTER — Inpatient Hospital Stay (HOSPITAL_COMMUNITY)
Admission: AD | Admit: 2012-08-03 | Discharge: 2012-08-03 | Disposition: A | Discharge status: Home / Self Care | Payer: Medicaid Other | Source: Ambulatory Visit | Attending: Obstetrics & Gynecology | Admitting: Obstetrics & Gynecology

## 2012-08-03 DIAGNOSIS — L089 Local infection of the skin and subcutaneous tissue, unspecified: Secondary | ICD-10-CM

## 2012-08-03 DIAGNOSIS — N760 Acute vaginitis: Secondary | ICD-10-CM | POA: Insufficient documentation

## 2012-08-03 DIAGNOSIS — B9689 Other specified bacterial agents as the cause of diseases classified elsewhere: Secondary | ICD-10-CM | POA: Insufficient documentation

## 2012-08-03 DIAGNOSIS — R109 Unspecified abdominal pain: Secondary | ICD-10-CM | POA: Insufficient documentation

## 2012-08-03 DIAGNOSIS — A499 Bacterial infection, unspecified: Secondary | ICD-10-CM | POA: Insufficient documentation

## 2012-08-03 HISTORY — DX: Sickle-cell trait: D57.3

## 2012-08-03 LAB — CBC
MCH: 27.8 pg (ref 26.0–34.0)
MCV: 83.5 fL (ref 78.0–100.0)
Platelets: 296 10*3/uL (ref 150–400)
RDW: 13.8 % (ref 11.5–15.5)

## 2012-08-03 LAB — WET PREP, GENITAL
Trich, Wet Prep: NONE SEEN
Yeast Wet Prep HPF POC: NONE SEEN

## 2012-08-03 MED ORDER — KETOROLAC TROMETHAMINE 60 MG/2ML IM SOLN
60.0000 mg | Freq: Once | INTRAMUSCULAR | Status: AC
  Administered 2012-08-03: 60 mg via INTRAMUSCULAR
  Filled 2012-08-03: qty 2

## 2012-08-03 MED ORDER — DOXYCYCLINE HYCLATE 100 MG PO CAPS: 100.0000 mg | ORAL_CAPSULE | Freq: Two times a day (BID) | ORAL | Status: AC

## 2012-08-03 MED ORDER — METRONIDAZOLE 500 MG PO TABS: 500.0000 mg | ORAL_TABLET | Freq: Two times a day (BID) | ORAL | Status: AC

## 2012-08-03 NOTE — MAU Note (Signed)
Patient states her Depo Provera is due tomorrow. Started cramping in the abdomen and back yesterday. This morning started bleeding with wiping. Has not had a period in 7 years. Was recently given an antibiotic shot for strep throat and her right arm at the side of injection is painful and a knot. Patient is not wearing a pad.

## 2012-08-03 NOTE — MAU Provider Note (Signed)
History     CSN: [id number]  Arrival date and time: 08/03/12 1200   First Provider Initiated Contact with Patient 08/03/12 1407      Chief Complaint  Patient presents with  . Abdominal Pain  . Vaginal Bleeding   HPI  Pt is not pregnant and has been on Depo Provera from [facility], due for injection tomorrow.  She started having lower abdominal pain Tuesday night after being treated for Strept throat on Tuesday.  She started bleeding this morning when she wiped and small clots.  Pt has not had a period in 7 years due to Depo Provera.  She denies vaginal discharge, pain with urination.    Past Medical History  Diagnosis Date  . Migraine   . Carpal tunnel syndrome   . Sickle cell trait     Past Surgical History  Procedure Date  . No past surgeries     History reviewed. No pertinent family history.  History  Substance Use Topics  . Smoking status: Never Smoker   . Smokeless tobacco: Not on file  . Alcohol Use: No    Allergies: No Known Allergies  Prescriptions prior to admission  Medication Sig Dispense Refill  . Biotin 1000 MCG tablet Take 1,000 mcg by mouth daily.      Marland Kitchen oxyCODONE (ROXICODONE) 5 MG immediate release tablet Take 5 mg by mouth daily as needed. Carpal tunnel pain.        ROS Physical Exam   Blood pressure 120/82, pulse 93, temperature 98.7 F (37.1 C), temperature source Oral, resp. rate 18, height 5\' 9"  (1.753 m), weight 148.417 kg (327 lb 3.2 oz), SpO2 100.00%.  Physical Exam  Vitals reviewed. Constitutional: She is oriented to person, place, and time. She appears well-developed and well-nourished.  HENT:  Head: Normocephalic.  Eyes: Pupils are equal, round, and reactive to light.  Neck: Normal range of motion. Neck supple.  Cardiovascular: Normal rate.   Respiratory: Effort normal.  GI: Soft. She exhibits no distension. There is no tenderness. There is no rebound.  Genitourinary:       Mod amount of BRB in vault; cervix clean, NT; uterus and  adnexa without palpable enlargement- nontender  Musculoskeletal: Normal range of motion.  Neurological: She is alert and oriented to person, place, and time.  Skin: Skin is warm and dry.       Pt has recent extensive tatoo on her right leg- pt says dog scratched her afterwards- pt has several areas of broken skin- without erythema or drainage-pt has a hx of MRSA-pt has appointment on Sept 10 with Dr.Lupton for psoriasis  Psychiatric: She has a normal mood and affect.    MAU Course  Procedures Results for orders placed during the hospital encounter of 08/03/12 (from the past 24 hour(s))  CBC     Status: Abnormal   Collection Time   08/03/12  1:21 PM      Component Value Range   WBC 13.9 (*) 4.0 - 10.5 K/uL   RBC 5.14 (*) 3.87 - 5.11 MIL/uL   Hemoglobin 14.3  12.0 - 15.0 g/dL   HCT 08.6  57.8 - 46.9 %   MCV 83.5  78.0 - 100.0 fL   MCH 27.8  26.0 - 34.0 pg   MCHC 33.3  30.0 - 36.0 g/dL   RDW 62.9  52.8 - 41.3 %   Platelets 296  150 - 400 K/uL  POCT PREGNANCY, URINE     Status: Normal   Collection Time  08/03/12  1:35 PM      Component Value Range   Preg Test, Ur NEGATIVE  NEGATIVE  WET PREP, GENITAL     Status: Abnormal   Collection Time   08/03/12  3:00 PM      Component Value Range   Yeast Wet Prep HPF POC NONE SEEN  NONE SEEN   Trich, Wet Prep NONE SEEN  NONE SEEN   Clue Cells Wet Prep HPF POC FEW (*) NONE SEEN   WBC, Wet Prep HPF POC FEW (*) NONE SEEN  Toradol 60mg  IM given for pain with relief   Assessment and Plan  Bacterial vaginosis- flagyl 500mg  BID for 7 days F/u with [facility] for contraception Rx for Doxycycline 500mg  BID for 10 days since pt has hx of MRSA and pt has some suspicious lesions on right leg F/u with dermatologist as scheduled  Camiya Vinal 08/03/2012, 2:07 PM

## 2012-08-04 LAB — GC/CHLAMYDIA PROBE AMP, GENITAL: Chlamydia, DNA Probe: NEGATIVE

## 2012-09-11 ENCOUNTER — Emergency Department (HOSPITAL_COMMUNITY): Payer: Medicaid Other

## 2012-09-11 ENCOUNTER — Encounter (HOSPITAL_COMMUNITY): Payer: Self-pay

## 2012-09-11 ENCOUNTER — Emergency Department (HOSPITAL_COMMUNITY)
Admission: EM | Admit: 2012-09-11 | Discharge: 2012-09-11 | Disposition: A | Discharge status: Home / Self Care | Payer: Medicaid Other | Attending: Emergency Medicine | Admitting: Emergency Medicine

## 2012-09-11 DIAGNOSIS — Z79899 Other long term (current) drug therapy: Secondary | ICD-10-CM | POA: Insufficient documentation

## 2012-09-11 DIAGNOSIS — M25579 Pain in unspecified ankle and joints of unspecified foot: Secondary | ICD-10-CM | POA: Insufficient documentation

## 2012-09-11 DIAGNOSIS — M79671 Pain in right foot: Secondary | ICD-10-CM

## 2012-09-11 DIAGNOSIS — X500XXA Overexertion from strenuous movement or load, initial encounter: Secondary | ICD-10-CM | POA: Insufficient documentation

## 2012-09-11 DIAGNOSIS — M79609 Pain in unspecified limb: Secondary | ICD-10-CM | POA: Insufficient documentation

## 2012-09-11 DIAGNOSIS — D573 Sickle-cell trait: Secondary | ICD-10-CM | POA: Insufficient documentation

## 2012-09-11 MED ORDER — IBUPROFEN 800 MG PO TABS
800.0000 mg | ORAL_TABLET | Freq: Once | ORAL | Status: AC
  Administered 2012-09-11: 800 mg via ORAL
  Filled 2012-09-11: qty 1

## 2012-09-11 NOTE — ED Provider Notes (Signed)
History     CSN: [id number]  Arrival date & time 09/11/12  1425   First MD Initiated Contact with Patient 09/11/12 1520      Chief Complaint  Patient presents with  . Ankle Pain    (Consider location/radiation/quality/duration/timing/severity/associated sxs/prior treatment) Patient is a 21 y.o. female presenting with ankle pain. The history is provided by the patient.  Ankle Pain  Incident onset: 1 month ago  The incident occurred at home. Injury mechanism: ankle roll after stepping on a toy  The pain is present in the right ankle. The quality of the pain is described as aching. The pain is at a severity of 5/10. The pain is mild. The pain has been constant since onset. Pertinent negatives include no numbness, no inability to bear weight (pain worsened by weight bearing, but normal ambulation ), no loss of motion, no muscle weakness, no loss of sensation and no tingling. She reports no foreign bodies present. The symptoms are aggravated by bearing weight, activity and palpation. She has tried nothing for the symptoms.    Past Medical History  Diagnosis Date  . Migraine   . Carpal tunnel syndrome   . Sickle cell trait     Past Surgical History  Procedure Date  . No past surgeries     No family history on file.  History  Substance Use Topics  . Smoking status: Never Smoker   . Smokeless tobacco: Not on file  . Alcohol Use: No    OB History    Grav Para Term Preterm Abortions TAB SAB Ect Mult Living   1 1 1       1       Review of Systems  Constitutional: Negative for fever, diaphoresis and activity change.  HENT: Negative for congestion and neck pain.   Respiratory: Negative for cough.   Genitourinary: Negative for dysuria.  Musculoskeletal: Negative for myalgias.  Skin: Negative for color change and wound.  Neurological: Negative for tingling, numbness and headaches.  All other systems reviewed and are negative.    Allergies  Review of patient's allergies  indicates no known allergies.  Home Medications   Current Outpatient Rx  Name Route Sig Dispense Refill  . ACETAMINOPHEN 500 MG PO TABS Oral Take 1,000 mg by mouth every 6 (six) hours as needed.    Marland Kitchen BIOTIN 1000 MCG PO TABS Oral Take 1,000 mcg by mouth daily.    . TRIAMCINOLONE ACETONIDE 0.025 % EX CREA Topical Apply 1 application topically 2 (two) times daily.      BP 136/79  Pulse 103  Temp 98.9 F (37.2 C) (Oral)  Resp 16  SpO2 99%  Physical Exam  Nursing note and vitals reviewed. Constitutional: She is oriented to person, place, and time. She appears well-developed and well-nourished. No distress.  HENT:  Head: Normocephalic and atraumatic.  Eyes: Conjunctivae normal and EOM are normal.  Neck: Normal range of motion. Neck supple.  Cardiovascular:       Intact distal pulses, capillary refill < 3 seconds  Pulmonary/Chest: Effort normal.  Musculoskeletal: Normal range of motion.       ttp over lateral and medial malleolus. No pain w ankle inversion or eversion, flexion induces pain. ttp along plantar surface. All other extremities with normal ROM  Neurological: She is alert and oriented to person, place, and time.       No sensory deficit  Skin: Skin is warm and dry. No rash noted. She is not diaphoretic.  Skin intact, no tenting  Psychiatric: She has a normal mood and affect. Her behavior is normal.    ED Course  Procedures (including critical care time)  Labs Reviewed - No data to display Dg Ankle Complete Right  09/11/2012  *RADIOLOGY REPORT*  Clinical Data: Right ankle pain  RIGHT ANKLE - COMPLETE 3+ VIEW  Comparison: None.  Findings: No fracture or dislocation.  No soft tissue abnormality. No radiopaque foreign body.  Ankle mortise is symmetric.  IMPRESSION: Normal exam.   Original Report Authenticated By: Harrel Lemon, M.D.    Dg Foot Complete Right  09/11/2012  *RADIOLOGY REPORT*  Clinical Data: Plantar right foot pain  RIGHT FOOT COMPLETE - 3+ VIEW   Comparison: None.  Findings: No fracture or dislocation.  No soft tissue abnormality. No radiopaque foreign body.  IMPRESSION: Normal exam.   Original Report Authenticated By: Harrel Lemon, M.D.      No diagnosis found.    MDM  Right foot/ankle pain Patient X-Ray negative for obvious fracture or dislocation. Pain managed in ED. Pt advised to follow up with orthopedics if symptoms persist for possibility of missed fracture diagnosis. Patient given brace while in ED, conservative therapy recommended and discussed. Patient will be dc home & is agreeable with above plan.         Jaci Carrel, [place] 09/11/12 1621

## 2012-09-11 NOTE — ED Notes (Signed)
Pt tripped and fell a month ago, c/o right foot pain since the fall, pain more severe at the bottom of the foot.

## 2012-09-13 NOTE — ED Provider Notes (Signed)
Medical screening examination/treatment/procedure(s) were performed by non-physician practitioner and as supervising physician I was immediately available for consultation/collaboration.  Ramez Arrona, MD 09/13/12 1804 

## 2012-10-25 ENCOUNTER — Encounter (HOSPITAL_COMMUNITY): Payer: Self-pay | Admitting: Emergency Medicine

## 2012-10-25 ENCOUNTER — Emergency Department (HOSPITAL_COMMUNITY): Payer: Medicaid Other

## 2012-10-25 ENCOUNTER — Emergency Department (HOSPITAL_COMMUNITY)
Admission: EM | Admit: 2012-10-25 | Discharge: 2012-10-25 | Disposition: A | Discharge status: Home / Self Care | Payer: Medicaid Other | Attending: Emergency Medicine | Admitting: Emergency Medicine

## 2012-10-25 DIAGNOSIS — J029 Acute pharyngitis, unspecified: Secondary | ICD-10-CM | POA: Insufficient documentation

## 2012-10-25 DIAGNOSIS — G5600 Carpal tunnel syndrome, unspecified upper limb: Secondary | ICD-10-CM | POA: Insufficient documentation

## 2012-10-25 DIAGNOSIS — Z8669 Personal history of other diseases of the nervous system and sense organs: Secondary | ICD-10-CM | POA: Insufficient documentation

## 2012-10-25 DIAGNOSIS — J069 Acute upper respiratory infection, unspecified: Secondary | ICD-10-CM | POA: Insufficient documentation

## 2012-10-25 DIAGNOSIS — R05 Cough: Secondary | ICD-10-CM | POA: Insufficient documentation

## 2012-10-25 DIAGNOSIS — Z862 Personal history of diseases of the blood and blood-forming organs and certain disorders involving the immune mechanism: Secondary | ICD-10-CM | POA: Insufficient documentation

## 2012-10-25 DIAGNOSIS — IMO0001 Reserved for inherently not codable concepts without codable children: Secondary | ICD-10-CM | POA: Insufficient documentation

## 2012-10-25 DIAGNOSIS — J3489 Other specified disorders of nose and nasal sinuses: Secondary | ICD-10-CM | POA: Insufficient documentation

## 2012-10-25 DIAGNOSIS — R059 Cough, unspecified: Secondary | ICD-10-CM | POA: Insufficient documentation

## 2012-10-25 DIAGNOSIS — R079 Chest pain, unspecified: Secondary | ICD-10-CM | POA: Insufficient documentation

## 2012-10-25 DIAGNOSIS — R509 Fever, unspecified: Secondary | ICD-10-CM | POA: Insufficient documentation

## 2012-10-25 MED ORDER — HYDROCOD POLST-CHLORPHEN POLST 10-8 MG/5ML PO LQCR
5.0000 mL | Freq: Once | ORAL | Status: AC
  Administered 2012-10-25: 5 mL via ORAL
  Filled 2012-10-25: qty 5

## 2012-10-25 MED ORDER — PROMETHAZINE-CODEINE 6.25-10 MG/5ML PO SYRP: 5.0000 mL | ORAL_SOLUTION | Freq: Four times a day (QID) | ORAL | Status: DC | PRN

## 2012-10-25 MED ORDER — IBUPROFEN 400 MG PO TABS
800.0000 mg | ORAL_TABLET | Freq: Once | ORAL | Status: AC
  Administered 2012-10-25: 800 mg via ORAL
  Filled 2012-10-25: qty 2

## 2012-10-25 MED ORDER — PROMETHAZINE-DM 6.25-15 MG/5ML PO SYRP: 5.0000 mL | ORAL_SOLUTION | Freq: Four times a day (QID) | ORAL | Status: DC | PRN

## 2012-10-25 NOTE — ED Notes (Signed)
Pt presents to the ED with cold and flu symptoms for one week.  Pt has been/currently coughing and coughing up yellow and greenish phlegm/sputum as described by patient.  Pt. Has been having hot and cold sensations but unsure of temperature/fever.

## 2012-10-25 NOTE — ED Provider Notes (Signed)
History     CSN: [id number]  Arrival date & time 10/25/12  2050   First MD Initiated Contact with Patient 10/25/12 2103      Chief Complaint  Patient presents with  . Influenza    (Consider location/radiation/quality/duration/timing/severity/associated sxs/prior treatment) HPI Comments: Patient was gradual onset of URI, symptoms, and cough.  Has been taking over-the-counter medications without relief, now she's having chest discomfort with cough.  Reports, the cough is not different in positions or activity level  Patient is a 21 y.o. female presenting with flu symptoms. The history is provided by the patient.  Influenza This is a new problem. The current episode started in the past 7 days. The problem occurs constantly. The problem has been unchanged. Associated symptoms include chest pain, congestion, coughing, a fever, myalgias and a sore throat. Pertinent negatives include no nausea or weakness.    Past Medical History  Diagnosis Date  . Migraine   . Carpal tunnel syndrome   . Sickle cell trait     Past Surgical History  Procedure Date  . No past surgeries   . Vascular surgery     History reviewed. No pertinent family history.  History  Substance Use Topics  . Smoking status: Never Smoker   . Smokeless tobacco: Not on file  . Alcohol Use: No    OB History    Grav Para Term Preterm Abortions TAB SAB Ect Mult Living   1 1 1       1       Review of Systems  Constitutional: Positive for fever.  HENT: Positive for congestion, sore throat and rhinorrhea. Negative for ear pain.   Respiratory: Positive for cough. Negative for shortness of breath.   Cardiovascular: Positive for chest pain.  Gastrointestinal: Negative for nausea.  Genitourinary: Negative.   Musculoskeletal: Positive for myalgias.  Neurological: Negative for dizziness and weakness.    Allergies  Review of patient's allergies indicates no known allergies.  Home Medications   Current  Outpatient Rx  Name  Route  Sig  Dispense  Refill  . BIOTIN 1000 MCG PO TABS   Oral   Take 1,000 mcg by mouth daily.         . CHLORPHEN-PSEUDOEPHED-APAP 2-30-325 MG PO TABS   Oral   Take 1 tablet by mouth every 6 (six) hours as needed. For cold symptoms         . PSEUDOEPH-DOXYLAMINE-DM-APAP 60-7.04-27-999 MG/30ML PO LIQD   Oral   Take 30 mLs by mouth at bedtime as needed. For cough         . TRIAMCINOLONE ACETONIDE 0.025 % EX CREA   Topical   Apply 1 application topically 2 (two) times daily.         Marland Kitchen PROMETHAZINE-DM 6.25-15 MG/5ML PO SYRP   Oral   Take 5 mLs by mouth 4 (four) times daily as needed for cough.   118 mL   0     BP 127/77  Pulse 106  Temp 99.7 F (37.6 C)  Resp 22  SpO2 98%  Physical Exam  Nursing note and vitals reviewed. Constitutional: She appears well-developed and well-nourished.       Morbidly obese  HENT:  Head: Normocephalic.  Eyes: Pupils are equal, round, and reactive to light.  Neck: Normal range of motion.  Cardiovascular: Tachycardia present.   Pulmonary/Chest: Effort normal and breath sounds normal. No respiratory distress. She has no wheezes.       Nonproductive dry cough  Musculoskeletal: Normal range  of motion.  Lymphadenopathy:    She has no cervical adenopathy.  Neurological: She is alert.  Skin: Skin is warm. No rash noted. No erythema.    ED Course  Procedures (including critical care time)  Labs Reviewed - No data to display Dg Chest 2 View  10/25/2012  *RADIOLOGY REPORT*  Clinical Data: Fever and cough.  Shortness of breath.  CHEST - 2 VIEW  Comparison: Chest x-ray 11/08/2010.  Findings: Lung volumes are normal.  No consolidative airspace disease.  No pleural effusions.  No pneumothorax.  No pulmonary nodule or mass noted.  Pulmonary vasculature and the cardiomediastinal silhouette are within normal limits.  IMPRESSION: 1. No radiographic evidence of acute cardiopulmonary disease.   Original Report Authenticated  By: Trudie Reed, M.D.      1. Upper respiratory infection with cough and congestion       MDM  Due to gradual onset of symptoms.  I doubt this is influenza, also, she's only had low-grade fever.  Chest x-ray was evaluated.  No indication of pneumonia.  We'll treat cough.  Symptoms and discharge home       Arman Filter, NP 10/25/12 2144

## 2012-10-25 NOTE — ED Provider Notes (Signed)
Medical screening examination/treatment/procedure(s) were performed by non-physician practitioner and as supervising physician I was immediately available for consultation/collaboration.   Loren Racer, MD 10/25/12 [phone]

## 2012-11-19 ENCOUNTER — Encounter (HOSPITAL_COMMUNITY): Payer: Self-pay | Admitting: Emergency Medicine

## 2012-11-19 ENCOUNTER — Emergency Department (HOSPITAL_COMMUNITY)
Admission: EM | Admit: 2012-11-19 | Discharge: 2012-11-20 | Disposition: A | Discharge status: Home / Self Care | Payer: Medicaid Other | Attending: Emergency Medicine | Admitting: Emergency Medicine

## 2012-11-19 DIAGNOSIS — Z79899 Other long term (current) drug therapy: Secondary | ICD-10-CM | POA: Insufficient documentation

## 2012-11-19 DIAGNOSIS — J111 Influenza due to unidentified influenza virus with other respiratory manifestations: Secondary | ICD-10-CM | POA: Insufficient documentation

## 2012-11-19 DIAGNOSIS — D573 Sickle-cell trait: Secondary | ICD-10-CM | POA: Insufficient documentation

## 2012-11-19 DIAGNOSIS — G5600 Carpal tunnel syndrome, unspecified upper limb: Secondary | ICD-10-CM | POA: Insufficient documentation

## 2012-11-19 DIAGNOSIS — J069 Acute upper respiratory infection, unspecified: Secondary | ICD-10-CM

## 2012-11-19 DIAGNOSIS — G43909 Migraine, unspecified, not intractable, without status migrainosus: Secondary | ICD-10-CM | POA: Insufficient documentation

## 2012-11-19 DIAGNOSIS — R51 Headache: Secondary | ICD-10-CM | POA: Insufficient documentation

## 2012-11-19 MED ORDER — HYDROCODONE-ACETAMINOPHEN 7.5-500 MG/15ML PO SOLN
10.0000 mL | Freq: Once | ORAL | Status: AC
  Administered 2012-11-19: 10 mL via ORAL
  Filled 2012-11-19: qty 15

## 2012-11-19 MED ORDER — DIPHENHYDRAMINE HCL 50 MG/ML IJ SOLN
25.0000 mg | Freq: Once | INTRAMUSCULAR | Status: AC
  Administered 2012-11-19: 25 mg via INTRAVENOUS
  Filled 2012-11-19: qty 1

## 2012-11-19 MED ORDER — SODIUM CHLORIDE 0.9 % IV BOLUS (SEPSIS)
1000.0000 mL | Freq: Once | INTRAVENOUS | Status: AC
  Administered 2012-11-19: 1000 mL via INTRAVENOUS

## 2012-11-19 MED ORDER — METOCLOPRAMIDE HCL 5 MG/ML IJ SOLN
10.0000 mg | Freq: Once | INTRAMUSCULAR | Status: AC
  Administered 2012-11-19: 10 mg via INTRAVENOUS
  Filled 2012-11-19: qty 2

## 2012-11-19 NOTE — ED Notes (Signed)
Pt states she woke up this am feeling like someone had been jumping on her body and her head  Pt states she has been drinking water all day and still feels like she is dehydrated  Pt denies N/V/D  Pt states she is dizzy when she stands up  Pt states she had a fever at home but it has come down now  Pt states she also has a cough and sore throat

## 2012-11-19 NOTE — ED Provider Notes (Signed)
History     CSN: [id number]  Arrival date & time 11/19/12  1843   First MD Initiated Contact with Patient 11/19/12 2114      Chief Complaint  Patient presents with  . Influenza    (Consider location/radiation/quality/duration/timing/severity/associated sxs/prior treatment) HPI  21 year old female with hx of sickle cell traits and hx of migraine presents c/o URI sxs.  Pt report she woke up this AM with persistent non productive cough, sneeze, runny nose, sore throat, and body aches.  She felt very dehydrated despite drinking plenty of water.  Her cough has caused her migraine headache to flare.  She felt dizzy, lightheadedness with subjective fever and chills.  Onset is gradual, persistent, moderate in severity, worsen throughout the day.  Denies double vision, productive cough, hemoptysis, DOE, n/v/d, abd pain, back pain, or dysuria.  No rash.  No treatment tried.    Past Medical History  Diagnosis Date  . Migraine   . Carpal tunnel syndrome   . Sickle cell trait     Past Surgical History  Procedure Date  . No past surgeries   . Vascular surgery     Family History  Problem Relation Age of Onset  . Diabetes Other     History  Substance Use Topics  . Smoking status: Never Smoker   . Smokeless tobacco: Not on file  . Alcohol Use: No    OB History    Grav Para Term Preterm Abortions TAB SAB Ect Mult Living   1 1 1       1       Review of Systems  Constitutional: Positive for fever and chills.  HENT: Positive for ear pain, congestion, sore throat, rhinorrhea and sneezing. Negative for neck stiffness.   Respiratory: Positive for cough. Negative for shortness of breath.   Cardiovascular: Negative for chest pain.  Skin: Negative for rash.  Neurological: Positive for headaches.    Allergies  Review of patient's allergies indicates no known allergies.  Home Medications   Current Outpatient Rx  Name  Route  Sig  Dispense  Refill  . BIOTIN 1000 MCG PO TABS  Oral   Take 1,000 mcg by mouth daily.         Marland Kitchen MEDROXYPROGESTERONE ACETATE 150 MG/ML IM SUSP   Intramuscular   Inject 150 mg into the muscle every 3 (three) months.         . TRIAMCINOLONE ACETONIDE 0.025 % EX CREA   Topical   Apply 1 application topically 2 (two) times daily.         Marland Kitchen PROMETHAZINE-DM 6.25-15 MG/5ML PO SYRP   Oral   Take 5 mLs by mouth 4 (four) times daily as needed for cough.   118 mL   0     BP 116/92  Pulse 116  Temp 99.4 F (37.4 C) (Oral)  Resp 20  SpO2 100%  Physical Exam  Nursing note and vitals reviewed. Constitutional: She is oriented to person, place, and time. She appears well-developed and well-nourished. No distress.       Awake, alert, nontoxic appearance  HENT:  Head: Atraumatic.  Right Ear: External ear normal.  Left Ear: External ear normal.  Nose: Nose normal.  Mouth/Throat: Oropharynx is clear and moist. No oropharyngeal exudate.       No nuchal rigidity  Eyes: Conjunctivae normal and EOM are normal. Pupils are equal, round, and reactive to light. Right eye exhibits no discharge. Left eye exhibits no discharge.  Neck: Normal range  of motion. Neck supple.  Cardiovascular: Normal rate and regular rhythm.   Pulmonary/Chest: Effort normal. No respiratory distress. She has no wheezes. She has no rales. She exhibits no tenderness.  Abdominal: Soft. There is no tenderness. There is no rebound.  Musculoskeletal: She exhibits no edema and no tenderness.       ROM appears intact, no obvious focal weakness  Lymphadenopathy:    She has no cervical adenopathy.  Neurological: She is alert and oriented to person, place, and time.       Mental status and motor strength appears intact  Skin: No rash noted.  Psychiatric: She has a normal mood and affect.    ED Course  Procedures (including critical care time)   Labs Reviewed  INFLUENZA PANEL BY PCR   Dg Chest 2 View  10/25/2012  *RADIOLOGY REPORT*  Clinical Data: Fever and cough.   Shortness of breath.  CHEST - 2 VIEW  Comparison: Chest x-ray 11/08/2010.  Findings: Lung volumes are normal.  No consolidative airspace disease.  No pleural effusions.  No pneumothorax.  No pulmonary nodule or mass noted.  Pulmonary vasculature and the cardiomediastinal silhouette are within normal limits.  IMPRESSION: 1. No radiographic evidence of acute cardiopulmonary disease.   Original Report Authenticated By: Trudie Reed, M.D.      1. URI 2. headache  MDM  Pt with URI sxs and migraine headache.  She is tachycardic HR 116, however nontoxic.  No focal neuro deficits, no nuchal rigidity.  Low suspicion for PE.  Will give IVF, migrain cocktail and cough medication.  Will reassess.    8:43 PM Pt continues to endorse a headache, and still feels dehydrated.    12:45 AM Pt felt better after IVF and pain medication.  I recommend close f/u with PCP.  Resource given.  Pain medication given.  Return precaution discussed.   BP 105/47  Pulse 118  Temp 99.7 F (37.6 C) (Oral)  Resp 20  SpO2 98%     Fayrene Helper, PA-C 11/20/12 [phone]

## 2012-11-20 MED ORDER — HYDROCODONE-ACETAMINOPHEN 7.5-500 MG/15ML PO SOLN: 15.0000 mL | Freq: Four times a day (QID) | ORAL | Status: DC | PRN

## 2012-11-20 MED ORDER — KETOROLAC TROMETHAMINE 30 MG/ML IJ SOLN
30.0000 mg | Freq: Once | INTRAMUSCULAR | Status: AC
  Administered 2012-11-20: 30 mg via INTRAVENOUS
  Filled 2012-11-20: qty 1

## 2012-11-20 MED ORDER — SODIUM CHLORIDE 0.9 % IV BOLUS (SEPSIS)
1000.0000 mL | Freq: Once | INTRAVENOUS | Status: AC
  Administered 2012-11-20: 1000 mL via INTRAVENOUS

## 2012-11-20 MED ORDER — GUAIFENESIN 100 MG/5ML PO LIQD: 100.0000 mg | ORAL | Status: DC | PRN

## 2012-11-20 NOTE — ED Provider Notes (Signed)
Medical screening examination/treatment/procedure(s) were performed by non-physician practitioner and as supervising physician I was immediately available for consultation/collaboration.   Flint Melter, MD 11/20/12 [phone]

## 2013-01-25 ENCOUNTER — Encounter (HOSPITAL_COMMUNITY): Payer: Self-pay | Admitting: Emergency Medicine

## 2013-01-25 ENCOUNTER — Emergency Department (HOSPITAL_COMMUNITY)
Admission: EM | Admit: 2013-01-25 | Discharge: 2013-01-25 | Disposition: A | Discharge status: Home / Self Care | Payer: Medicaid Other | Attending: Emergency Medicine | Admitting: Emergency Medicine

## 2013-01-25 DIAGNOSIS — H53149 Visual discomfort, unspecified: Secondary | ICD-10-CM | POA: Insufficient documentation

## 2013-01-25 DIAGNOSIS — Z8669 Personal history of other diseases of the nervous system and sense organs: Secondary | ICD-10-CM | POA: Insufficient documentation

## 2013-01-25 DIAGNOSIS — R110 Nausea: Secondary | ICD-10-CM | POA: Insufficient documentation

## 2013-01-25 DIAGNOSIS — R29898 Other symptoms and signs involving the musculoskeletal system: Secondary | ICD-10-CM | POA: Insufficient documentation

## 2013-01-25 DIAGNOSIS — Z862 Personal history of diseases of the blood and blood-forming organs and certain disorders involving the immune mechanism: Secondary | ICD-10-CM | POA: Insufficient documentation

## 2013-01-25 DIAGNOSIS — Z79899 Other long term (current) drug therapy: Secondary | ICD-10-CM | POA: Insufficient documentation

## 2013-01-25 DIAGNOSIS — R51 Headache: Secondary | ICD-10-CM | POA: Insufficient documentation

## 2013-01-25 MED ORDER — KETOROLAC TROMETHAMINE 30 MG/ML IJ SOLN
30.0000 mg | Freq: Once | INTRAMUSCULAR | Status: AC
  Administered 2013-01-25: 30 mg via INTRAVENOUS
  Filled 2013-01-25: qty 1

## 2013-01-25 MED ORDER — SODIUM CHLORIDE 0.9 % IV BOLUS (SEPSIS)
1000.0000 mL | Freq: Once | INTRAVENOUS | Status: AC
  Administered 2013-01-25: 1000 mL via INTRAVENOUS

## 2013-01-25 MED ORDER — METOCLOPRAMIDE HCL 5 MG/ML IJ SOLN
10.0000 mg | Freq: Once | INTRAMUSCULAR | Status: AC
  Administered 2013-01-25: 10 mg via INTRAVENOUS
  Filled 2013-01-25: qty 2

## 2013-01-25 MED ORDER — IBUPROFEN 800 MG PO TABS: 800.0000 mg | ORAL_TABLET | Freq: Three times a day (TID) | ORAL | Status: DC | PRN

## 2013-01-25 MED ORDER — CYCLOBENZAPRINE HCL 10 MG PO TABS: 10.0000 mg | ORAL_TABLET | Freq: Three times a day (TID) | ORAL | Status: DC | PRN

## 2013-01-25 NOTE — ED Provider Notes (Signed)
History     CSN: [id number]  Arrival date & time 01/25/13  1623   First MD Initiated Contact with Patient 01/25/13 1628      Chief Complaint  Patient presents with  . Migraine    (Consider location/radiation/quality/duration/timing/severity/associated sxs/prior treatment) Patient is a 22 y.o. female presenting with migraines.  Migraine   Pt with history of headaches reports moderate to severe diffuse throbbing/aching headache for the last 2 days, associated with tightness in her shoulders, nausea and photophobia. Similar to previous. Not the worst headache of her life.   Past Medical History  Diagnosis Date  . Migraine   . Carpal tunnel syndrome   . Sickle cell trait     Past Surgical History  Procedure Laterality Date  . No past surgeries    . Vascular surgery      Family History  Problem Relation Age of Onset  . Diabetes Other     History  Substance Use Topics  . Smoking status: Never Smoker   . Smokeless tobacco: Not on file  . Alcohol Use: No    OB History   Grav Para Term Preterm Abortions TAB SAB Ect Mult Living   1 1 1       1       Review of Systems All other systems reviewed and are negative except as noted in HPI.   Allergies  Review of patient's allergies indicates no known allergies.  Home Medications   Current Outpatient Rx  Name  Route  Sig  Dispense  Refill  . Biotin 1000 MCG tablet   Oral   Take 1,000 mcg by mouth daily.         . medroxyPROGESTERone (DEPO-PROVERA) 150 MG/ML injection   Intramuscular   Inject 150 mg into the muscle every 3 (three) months.           BP 133/64  Pulse 73  Temp(Src) 98.8 F (37.1 C) (Oral)  Resp 16  SpO2 99%  Physical Exam  Nursing note and vitals reviewed. Constitutional: She is oriented to person, place, and time. She appears well-developed and well-nourished.  HENT:  Head: Normocephalic and atraumatic.  Eyes: EOM are normal. Pupils are equal, round, and reactive to light.  Neck:  Normal range of motion. Neck supple.  Cardiovascular: Normal rate, normal heart sounds and intact distal pulses.   Pulmonary/Chest: Effort normal and breath sounds normal.  Abdominal: Bowel sounds are normal. She exhibits no distension. There is no tenderness.  Musculoskeletal: Normal range of motion. She exhibits no edema and no tenderness.  Neurological: She is alert and oriented to person, place, and time. She has normal strength. No cranial nerve deficit or sensory deficit.  Skin: Skin is warm and dry. No rash noted.  Psychiatric: She has a normal mood and affect.    ED Course  Procedures (including critical care time)  Labs Reviewed - No data to display No results found.   No diagnosis found.    MDM  Pain improved some. Suspect a combination of migraine headache and tension headache. Will d/c with NSAID, muscle relaxer, PCP followup.         Charles B. Bernette Mayers, MD 01/25/13 1901

## 2013-01-25 NOTE — ED Notes (Signed)
BIB self. Patient self reports migraines that are recurrent. Increasing today. Bernette Mayers MD at bedside

## 2013-02-12 ENCOUNTER — Inpatient Hospital Stay (HOSPITAL_COMMUNITY)
Admission: AD | Admit: 2013-02-12 | Discharge: 2013-02-12 | Disposition: A | Discharge status: Home / Self Care | Payer: Medicaid Other | Source: Ambulatory Visit | Attending: Obstetrics & Gynecology | Admitting: Obstetrics & Gynecology

## 2013-02-12 ENCOUNTER — Encounter (HOSPITAL_COMMUNITY): Payer: Self-pay | Admitting: *Deleted

## 2013-02-12 DIAGNOSIS — A5901 Trichomonal vulvovaginitis: Secondary | ICD-10-CM

## 2013-02-12 DIAGNOSIS — N949 Unspecified condition associated with female genital organs and menstrual cycle: Secondary | ICD-10-CM | POA: Insufficient documentation

## 2013-02-12 DIAGNOSIS — N611 Abscess of the breast and nipple: Secondary | ICD-10-CM

## 2013-02-12 DIAGNOSIS — L02229 Furuncle of trunk, unspecified: Secondary | ICD-10-CM | POA: Insufficient documentation

## 2013-02-12 DIAGNOSIS — L02239 Carbuncle of trunk, unspecified: Secondary | ICD-10-CM | POA: Insufficient documentation

## 2013-02-12 LAB — URINALYSIS, ROUTINE W REFLEX MICROSCOPIC
Bilirubin Urine: NEGATIVE
Glucose, UA: NEGATIVE mg/dL
Ketones, ur: NEGATIVE mg/dL
Nitrite: NEGATIVE
Specific Gravity, Urine: 1.025 (ref 1.005–1.030)
pH: 6 (ref 5.0–8.0)

## 2013-02-12 LAB — URINE MICROSCOPIC-ADD ON

## 2013-02-12 LAB — WET PREP, GENITAL

## 2013-02-12 MED ORDER — SULFAMETHOXAZOLE-TRIMETHOPRIM 800-160 MG PO TABS: 1.0000 | ORAL_TABLET | Freq: Two times a day (BID) | ORAL | Status: DC

## 2013-02-12 MED ORDER — METRONIDAZOLE 500 MG PO TABS
2000.0000 mg | ORAL_TABLET | Freq: Once | ORAL | Status: AC
  Administered 2013-02-12: 2000 mg via ORAL
  Filled 2013-02-12: qty 4

## 2013-02-12 NOTE — MAU Note (Signed)
Discharge for past 2 days, no itching, irritation or odor.  Boil under left breast first noted this morning.

## 2013-02-12 NOTE — MAU Provider Note (Signed)
History     CSN: [id number]  Arrival date and time: 02/12/13 0865   First Provider Initiated Contact with Patient 02/12/13 [phone]      Chief Complaint  Patient presents with  . Vaginal Discharge   HPI  Anne Ray is a 22 y.o. who presents today with creamy white discharge x 2 days. She denies any itching odor or irritation. She also woke up this morning and had a boil under her left breast. She states she has had a boil incised and drained in the past at [facility] ED with a history of MRSA.   Past Medical History  Diagnosis Date  . Migraine   . Carpal tunnel syndrome   . Sickle cell trait     Past Surgical History  Procedure Laterality Date  . No past surgeries    . Vascular surgery      Family History  Problem Relation Age of Onset  . Diabetes Other     History  Substance Use Topics  . Smoking status: Never Smoker   . Smokeless tobacco: Not on file  . Alcohol Use: No    Allergies: No Known Allergies  Prescriptions prior to admission  Medication Sig Dispense Refill  . Biotin 1000 MCG tablet Take 1,000 mcg by mouth daily.      . cyclobenzaprine (FLEXERIL) 10 MG tablet Take 1 tablet (10 mg total) by mouth 3 (three) times daily as needed for muscle spasms.  30 tablet  0  . ibuprofen (ADVIL,MOTRIN) 800 MG tablet Take 1 tablet (800 mg total) by mouth every 8 (eight) hours as needed for pain.  30 tablet  0  . medroxyPROGESTERone (DEPO-PROVERA) 150 MG/ML injection Inject 150 mg into the muscle every 3 (three) months.        Review of Systems  Constitutional: Negative for fever and chills.  Eyes: Negative for blurred vision.  Gastrointestinal: Negative for nausea, vomiting, abdominal pain, diarrhea and constipation.  Genitourinary: Negative for dysuria, urgency and frequency.  Musculoskeletal: Negative for myalgias.  Neurological: Negative for dizziness.   Physical Exam   Blood pressure 137/75, pulse 69, temperature 98.8 F (37.1 C), temperature source Oral,  resp. rate 20.  Physical Exam  Nursing note and vitals reviewed. Constitutional: She is oriented to person, place, and time. She appears well-developed and well-nourished. No distress.  Cardiovascular: Normal rate.   Respiratory: Effort normal.  GI: Soft. She exhibits no distension. There is no tenderness.  Genitourinary:   External: no lesion Vagina: small amount of white discharge seen Cervix: pink, smooth Uterus: AV, NSSC  Neurological: She is alert and oriented to person, place, and time.  Skin: Skin is warm and dry.  Psychiatric: She has a normal mood and affect.   Breast: small 1.5cm x 1.5cm, erythematous boil on the underside of the left breast.   MAU Course  Procedures  Results for orders placed during the hospital encounter of 02/12/13 (from the past 24 hour(s))  URINALYSIS, ROUTINE W REFLEX MICROSCOPIC     Status: Abnormal   Collection Time    02/12/13  6:50 AM      Result Value Range   Color, Urine YELLOW  YELLOW   APPearance CLOUDY (*) CLEAR   Specific Gravity, Urine 1.025  1.005 - 1.030   pH 6.0  5.0 - 8.0   Glucose, UA NEGATIVE  NEGATIVE mg/dL   Hgb urine dipstick SMALL (*) NEGATIVE   Bilirubin Urine NEGATIVE  NEGATIVE   Ketones, ur NEGATIVE  NEGATIVE mg/dL  Protein, ur NEGATIVE  NEGATIVE mg/dL   Urobilinogen, UA 0.2  0.0 - 1.0 mg/dL   Nitrite NEGATIVE  NEGATIVE   Leukocytes, UA LARGE (*) NEGATIVE  URINE MICROSCOPIC-ADD ON     Status: Abnormal   Collection Time    02/12/13  6:50 AM      Result Value Range   Squamous Epithelial / LPF MANY (*) RARE   WBC, UA 21-50  <3 WBC/hpf   RBC / HPF 3-6  <3 RBC/hpf   Urine-Other TRICHOMONAS PRESENT    POCT PREGNANCY, URINE     Status: None   Collection Time    02/12/13  6:52 AM      Result Value Range   Preg Test, Ur NEGATIVE  NEGATIVE  WET PREP, GENITAL     Status: Abnormal   Collection Time    02/12/13  7:19 AM      Result Value Range   Yeast Wet Prep HPF POC NEGATIVE (*) NONE SEEN   Trich, Wet Prep  MODERATE (*) NONE SEEN   Clue Cells Wet Prep HPF POC NEGATIVE (*) NONE SEEN   WBC, Wet Prep HPF POC MODERATE (*) NONE SEEN     Assessment and Plan   1. Boil, breast   2. Trichomonas vaginalis (TV) infection    Treated with flagyl 2 g here in MAU Stressed the importance of partner treatment Bactrim DS BID x 7 days Warm compresses to affected areas  Return to ED if sx worsen of persist.   Tawnya Crook 02/12/2013, 7:57 AM

## 2013-02-12 NOTE — MAU Note (Signed)
Patient complains of white creamy vaginal discharge for the last two days. No itching or foul odor. Patient also states this morning when she woke up she noticed a boil under her left breast.

## 2013-02-12 NOTE — Discharge Instructions (Signed)
Abscess An abscess is an infected area that contains a collection of pus and debris. It can occur in almost any part of the body. An abscess is also known as a furuncle or boil. CAUSES   An abscess occurs when tissue gets infected. This can occur from blockage of oil or sweat glands, infection of hair follicles, or a minor injury to the skin. As the body tries to fight the infection, pus collects in the area and creates pressure under the skin. This pressure causes pain. People with weakened immune systems have difficulty fighting infections and get certain abscesses more often.   SYMPTOMS Usually an abscess develops on the skin and becomes a painful mass that is red, warm, and tender. If the abscess forms under the skin, you may feel a moveable soft area under the skin. Some abscesses break open (rupture) on their own, but most will continue to get worse without care. The infection can spread deeper into the body and eventually into the bloodstream, causing you to feel ill.   DIAGNOSIS   Your caregiver will take your medical history and perform a physical exam. A sample of fluid may also be taken from the abscess to determine what is causing your infection. TREATMENT   Your caregiver may prescribe antibiotic medicines to fight the infection. However, taking antibiotics alone usually does not cure an abscess. Your caregiver may need to make a small cut (incision) in the abscess to drain the pus. In some cases, gauze is packed into the abscess to reduce pain and to continue draining the area. HOME CARE INSTRUCTIONS    Only take over-the-counter or prescription medicines for pain, discomfort, or fever as directed by your caregiver.   If you were prescribed antibiotics, take them as directed. Finish them even if you start to feel better.   If gauze is used, follow your caregiver's directions for changing the gauze.   To avoid spreading the infection:   Keep your draining abscess covered with a  bandage.   Wash your hands well.   Do not share personal care items, towels, or whirlpools with others.   Avoid skin contact with others.   Keep your skin and clothes clean around the abscess.   Keep all follow-up appointments as directed by your caregiver.  SEEK MEDICAL CARE IF:    You have increased pain, swelling, redness, fluid drainage, or bleeding.   You have muscle aches, chills, or a general ill feeling.   You have a fever.  MAKE SURE YOU:    Understand these instructions.   Will watch your condition.   Will get help right away if you are not doing well or get worse.  Document Released: 08/25/2005 Document Revised: 05/16/2012 Document Reviewed: 01/28/2012 [employer] Patient Information 2013 [employer].    

## 2013-02-13 LAB — GC/CHLAMYDIA PROBE AMP: CT Probe RNA: NEGATIVE

## 2013-02-14 NOTE — MAU Provider Note (Signed)
Attestation of Attending Supervision of Advanced Practitioner (CNM/NP): Evaluation and management procedures were performed by the Advanced Practitioner under my supervision and collaboration.  I have reviewed the Advanced Practitioner's note and chart, and I agree with the management and plan.  HARRAWAY-SMITH, Kili Gracy 9:49 AM     

## 2013-04-20 ENCOUNTER — Inpatient Hospital Stay (HOSPITAL_COMMUNITY)
Admission: AD | Admit: 2013-04-20 | Discharge: 2013-04-20 | Disposition: A | Discharge status: Home / Self Care | Payer: PRIVATE HEALTH INSURANCE | Source: Ambulatory Visit | Attending: Obstetrics and Gynecology | Admitting: Obstetrics and Gynecology

## 2013-04-20 ENCOUNTER — Encounter (HOSPITAL_COMMUNITY): Payer: Self-pay | Admitting: *Deleted

## 2013-04-20 DIAGNOSIS — N760 Acute vaginitis: Secondary | ICD-10-CM | POA: Insufficient documentation

## 2013-04-20 DIAGNOSIS — N949 Unspecified condition associated with female genital organs and menstrual cycle: Secondary | ICD-10-CM | POA: Insufficient documentation

## 2013-04-20 DIAGNOSIS — B9689 Other specified bacterial agents as the cause of diseases classified elsewhere: Secondary | ICD-10-CM

## 2013-04-20 DIAGNOSIS — A499 Bacterial infection, unspecified: Secondary | ICD-10-CM | POA: Insufficient documentation

## 2013-04-20 LAB — URINALYSIS, ROUTINE W REFLEX MICROSCOPIC
Glucose, UA: NEGATIVE mg/dL
Ketones, ur: NEGATIVE mg/dL
Protein, ur: NEGATIVE mg/dL
Urobilinogen, UA: 0.2 mg/dL (ref 0.0–1.0)

## 2013-04-20 LAB — URINE MICROSCOPIC-ADD ON

## 2013-04-20 LAB — WET PREP, GENITAL

## 2013-04-20 LAB — POCT PREGNANCY, URINE: Preg Test, Ur: NEGATIVE

## 2013-04-20 MED ORDER — METRONIDAZOLE 500 MG PO TABS: 500.0000 mg | ORAL_TABLET | Freq: Two times a day (BID) | ORAL | Status: DC

## 2013-04-20 NOTE — MAU Provider Note (Signed)
History     CSN: [id number]  Arrival date and time: 04/20/13 7829   First Provider Initiated Contact with Patient 04/20/13 0932      Chief Complaint  Patient presents with  . odor in urine    HPI Anne Ray is a 22 y.o. G1P1001 who presents to MAU today with complaint of malodorous urine. The patient states that she recently finished a course of Antibiotics for a tooth issue and noticed the smell around that time. She states that this occurred ~ 1 week ago. She denies abdominal pain, vaginal discharge, vaginal bleeding, N/V or fever.   OB History   Grav Para Term Preterm Abortions TAB SAB Ect Mult Living   1 1 1       1       Past Medical History  Diagnosis Date  . Migraine   . Carpal tunnel syndrome   . Sickle cell trait     Past Surgical History  Procedure Laterality Date  . No past surgeries    . Vascular surgery      Family History  Problem Relation Age of Onset  . Diabetes Other     History  Substance Use Topics  . Smoking status: Never Smoker   . Smokeless tobacco: Not on file  . Alcohol Use: No    Allergies: No Known Allergies  Prescriptions prior to admission  Medication Sig Dispense Refill  . Biotin 1000 MCG tablet Take 1,000 mcg by mouth daily.      . medroxyPROGESTERone (DEPO-PROVERA) 150 MG/ML injection Inject 150 mg into the muscle every 3 (three) months. Patient received injection 04-06-13        Review of Systems  Constitutional: Negative for fever and malaise/fatigue.  Gastrointestinal: Negative for nausea, vomiting and abdominal pain.  Genitourinary: Negative for dysuria, urgency and frequency.       Neg - vaginal discharge, bleeding   Physical Exam   Blood pressure 127/65, pulse 76, temperature 98.7 F (37.1 C), temperature source Oral, resp. rate 16, height 5\' 9"  (1.753 m), weight 146.058 kg (322 lb).  Physical Exam  Constitutional: She is oriented to person, place, and time. She appears well-developed and  well-nourished. No distress.  HENT:  Head: Normocephalic and atraumatic.  Cardiovascular: Normal rate.   Respiratory: Effort normal.  GI: Soft. She exhibits no distension and no mass. There is no tenderness. There is no rebound and no guarding.  Genitourinary: Uterus is not enlarged and not tender. Cervix exhibits no motion tenderness, no discharge and no friability. Right adnexum displays no mass and no tenderness. Left adnexum displays no mass and no tenderness. No bleeding around the vagina. Vaginal discharge (small amount of thin white discharge noted) found.  Neurological: She is alert and oriented to person, place, and time.  Skin: Skin is warm and dry. No erythema.  Psychiatric: She has a normal mood and affect.   Results for orders placed during the hospital encounter of 04/20/13 (from the past 24 hour(s))  URINALYSIS, ROUTINE W REFLEX MICROSCOPIC     Status: Abnormal   Collection Time    04/20/13  8:55 AM      Result Value Range   Color, Urine YELLOW  YELLOW   APPearance CLEAR  CLEAR   Specific Gravity, Urine 1.020  1.005 - 1.030   pH 6.0  5.0 - 8.0   Glucose, UA NEGATIVE  NEGATIVE mg/dL   Hgb urine dipstick NEGATIVE  NEGATIVE   Bilirubin Urine NEGATIVE  NEGATIVE   Ketones,  ur NEGATIVE  NEGATIVE mg/dL   Protein, ur NEGATIVE  NEGATIVE mg/dL   Urobilinogen, UA 0.2  0.0 - 1.0 mg/dL   Nitrite NEGATIVE  NEGATIVE   Leukocytes, UA TRACE (*) NEGATIVE  URINE MICROSCOPIC-ADD ON     Status: Abnormal   Collection Time    04/20/13  8:55 AM      Result Value Range   Squamous Epithelial / LPF FEW (*) RARE   WBC, UA 0-2  <3 WBC/hpf  POCT PREGNANCY, URINE     Status: None   Collection Time    04/20/13  9:22 AM      Result Value Range   Preg Test, Ur NEGATIVE  NEGATIVE  WET PREP, GENITAL     Status: Abnormal   Collection Time    04/20/13  9:36 AM      Result Value Range   Yeast Wet Prep HPF POC NONE SEEN  NONE SEEN   Trich, Wet Prep NONE SEEN  NONE SEEN   Clue Cells Wet Prep HPF  POC FEW (*) NONE SEEN   WBC, Wet Prep HPF POC FEW (*) NONE SEEN    MAU Course  Procedures None  MDM UA, UPT, Wet prep and GC/Chlamydia  Assessment and Plan  A: Bacterial vaginosis  P: Discharge home Rx for Flagyl sent to patient's pharmacy Discussed hygiene products and probiotics for avoiding recurrence of BV Urine culture and GC/Chlamydia pending Patient encouraged to keep scheduled follow-up with [facility] for Depo Provera and routine health care Patient may return to MAU as needed  Freddi Starr, PA-C  04/20/2013, 9:56 AM

## 2013-04-20 NOTE — MAU Note (Signed)
Pt states odor in urine that started 1 week ago while taking an antibiotic.

## 2013-04-21 LAB — GC/CHLAMYDIA PROBE AMP: CT Probe RNA: NEGATIVE

## 2013-04-22 NOTE — MAU Provider Note (Signed)
Attestation of Attending Supervision of Advanced Practitioner (CNM/NP): Evaluation and management procedures were performed by the Advanced Practitioner under my supervision and collaboration.  I have reviewed the Advanced Practitioner's note and chart, and I agree with the management and plan.  Richelle Glick 04/22/2013 7:46 AM   

## 2013-05-07 ENCOUNTER — Emergency Department (HOSPITAL_COMMUNITY)
Admission: EM | Admit: 2013-05-07 | Discharge: 2013-05-07 | Disposition: A | Discharge status: Home / Self Care | Payer: Medicaid Other | Attending: Emergency Medicine | Admitting: Emergency Medicine

## 2013-05-07 ENCOUNTER — Encounter (HOSPITAL_COMMUNITY): Payer: Self-pay | Admitting: Emergency Medicine

## 2013-05-07 DIAGNOSIS — Z8669 Personal history of other diseases of the nervous system and sense organs: Secondary | ICD-10-CM | POA: Insufficient documentation

## 2013-05-07 DIAGNOSIS — R21 Rash and other nonspecific skin eruption: Secondary | ICD-10-CM | POA: Insufficient documentation

## 2013-05-07 DIAGNOSIS — Y9389 Activity, other specified: Secondary | ICD-10-CM | POA: Insufficient documentation

## 2013-05-07 DIAGNOSIS — S30860A Insect bite (nonvenomous) of lower back and pelvis, initial encounter: Secondary | ICD-10-CM

## 2013-05-07 DIAGNOSIS — L299 Pruritus, unspecified: Secondary | ICD-10-CM | POA: Insufficient documentation

## 2013-05-07 DIAGNOSIS — Y929 Unspecified place or not applicable: Secondary | ICD-10-CM | POA: Insufficient documentation

## 2013-05-07 DIAGNOSIS — IMO0002 Reserved for concepts with insufficient information to code with codable children: Secondary | ICD-10-CM | POA: Insufficient documentation

## 2013-05-07 DIAGNOSIS — Z79899 Other long term (current) drug therapy: Secondary | ICD-10-CM | POA: Insufficient documentation

## 2013-05-07 DIAGNOSIS — Z862 Personal history of diseases of the blood and blood-forming organs and certain disorders involving the immune mechanism: Secondary | ICD-10-CM | POA: Insufficient documentation

## 2013-05-07 MED ORDER — FAMOTIDINE 20 MG PO TABS: ORAL_TABLET | ORAL | Status: DC

## 2013-05-07 MED ORDER — FAMOTIDINE 20 MG PO TABS
20.0000 mg | ORAL_TABLET | Freq: Once | ORAL | Status: AC
  Administered 2013-05-07: 20 mg via ORAL
  Filled 2013-05-07: qty 1

## 2013-05-07 MED ORDER — DIPHENHYDRAMINE HCL 25 MG PO TABS: 25.0000 mg | ORAL_TABLET | Freq: Four times a day (QID) | ORAL | Status: DC | PRN

## 2013-05-07 MED ORDER — DIPHENHYDRAMINE HCL 25 MG PO CAPS
50.0000 mg | ORAL_CAPSULE | Freq: Once | ORAL | Status: AC
  Administered 2013-05-07: 50 mg via ORAL
  Filled 2013-05-07: qty 2

## 2013-05-07 MED ORDER — PREDNISONE 20 MG PO TABS
60.0000 mg | ORAL_TABLET | Freq: Once | ORAL | Status: AC
  Administered 2013-05-07: 60 mg via ORAL
  Filled 2013-05-07: qty 3

## 2013-05-07 MED ORDER — PREDNISONE 20 MG PO TABS: 40.0000 mg | ORAL_TABLET | Freq: Every day | ORAL | Status: DC

## 2013-05-07 NOTE — ED Provider Notes (Signed)
I have reviewed the report and personally reviewed the above radiology studies.    Hilario Quarry, MD 05/07/13 [phone]

## 2013-05-07 NOTE — ED Notes (Signed)
PT reports multiple episodes of skin infections following bug bites. Pt reported bite occurred on Sat . While out side in garden. On assessment raised area on Lt cheek of buttocks ,no drainage noted.

## 2013-05-07 NOTE — ED Provider Notes (Signed)
History     CSN: [id number]  Arrival date & time 05/07/13  1102   First MD Initiated Contact with Patient 05/07/13 1234      Chief Complaint  Patient presents with  . Insect Bite    (Consider location/radiation/quality/duration/timing/severity/associated sxs/prior treatment) HPI Comments: 22 y.o. 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 Onset Saturday while she was outside gardening. States daughter saw the mosquito and killed it "on the spot." Pt states it was extremely pruritic yesterday, did not take any interventions, and decided to come in since she has "waited too long in the past," and similar bites have turned into infections. Pt states the itching is severe, uncomfortable, but not painful. Does not radiate. Has not gotten any better. Pt denies fever, chills, feeling of airway closing, tongue swelling, difficulty breathing, swelling of lips face or tongue.   Patient is a 22 y.o. female presenting with allergic reaction. The history is provided by the patient.  Allergic Reaction Presenting symptoms: itching, rash and swelling   Presenting symptoms: no difficulty breathing, no difficulty swallowing and no wheezing   Itching:    Location: left sacral area.   Severity:  Severe   Onset quality:  Sudden   Duration: 2 days.   Timing:  Constant   Progression:  Unchanged Severity:  Severe Prior allergic episodes:  Insect allergies (mosquito) Context: insect bite/sting   Context comment:  Mosquito Relieved by:  None tried Worsened by:  Nothing tried Ineffective treatments:  None tried   Past Medical History  Diagnosis Date  . Migraine   . Carpal tunnel syndrome   . Sickle cell trait     Past Surgical History  Procedure Laterality Date  . No past surgeries    . Vascular surgery      Family History  Problem Relation Age of Onset   . Diabetes Other     History  Substance Use Topics  . Smoking status: Never Smoker   . Smokeless tobacco: Not on file  . Alcohol Use: No    OB History   Grav Para Term Preterm Abortions TAB SAB Ect Mult Living   1 1 1       1       Review of Systems  Constitutional: Negative for fever and diaphoresis.  HENT: Negative for trouble swallowing, neck pain and neck stiffness.   Eyes: Negative for visual disturbance.  Respiratory: Negative for apnea, chest tightness, shortness of breath and wheezing.   Cardiovascular: Negative for chest pain and palpitations.  Gastrointestinal: Negative for nausea, vomiting, diarrhea and constipation.  Genitourinary: Negative for dysuria.  Musculoskeletal: Negative for gait problem.  Skin: Positive for color change, itching and rash.       Red, raised, itchy area to left sacrum.  Neurological: Negative for dizziness, weakness, light-headedness, numbness and headaches.    Allergies  Kiwi extract  Home Medications   Current Outpatient Rx  Name  Route  Sig  Dispense  Refill  . Biotin 1000 MCG tablet   Oral   Take 1,000 mcg by mouth daily.         . medroxyPROGESTERone (DEPO-PROVERA) 150 MG/ML injection   Intramuscular   Inject 150 mg into the muscle every 3 (three) months. Last dose 04/06/2013.         . triamcinolone cream (KENALOG) 0.1 %   Topical   Apply 1 application topically daily as needed (  for eczema).         . diphenhydrAMINE (BENADRYL) 25 MG tablet   Oral   Take 1 tablet (25 mg total) by mouth every 6 (six) hours as needed for itching (Rash).   30 tablet   0   . famotidine (PEPCID) 20 MG tablet      Take 1 tablet (20 mg total) by mouth 2 (two) times daily for 5 days (can purchase over the counter)   10 tablet   0   . predniSONE (DELTASONE) 20 MG tablet   Oral   Take 2 tablets (40 mg total) by mouth daily.   10 tablet   0     BP 116/72  Pulse 68  Temp(Src) 99.1 F (37.3 C) (Oral)  Resp 16  Wt 325 lb  (147.419 kg)  BMI 47.97 kg/m2  SpO2 99%  Physical Exam  Nursing note and vitals reviewed. Constitutional: She is oriented to person, place, and time. She appears well-developed and well-nourished. No distress.  HENT:  Head: Normocephalic and atraumatic.  Eyes: Conjunctivae and EOM are normal.  Neck: Normal range of motion. Neck supple.  No meningeal signs  Cardiovascular: Normal rate, regular rhythm and normal heart sounds.  Exam reveals no gallop and no friction rub.   No murmur heard. Pulmonary/Chest: Effort normal and breath sounds normal. No respiratory distress. She has no wheezes. She has no rales. She exhibits no tenderness.  Abdominal: Soft. Bowel sounds are normal. She exhibits no distension. There is no tenderness. There is no rebound and no guarding.  Musculoskeletal: Normal range of motion. She exhibits no edema and no tenderness.  Neurological: She is alert and oriented to person, place, and time. No cranial nerve deficit.  Skin: Skin is warm and dry. She is not diaphoretic. There is erythema.     At left sacrum: several small, raised, mildly edematous, erythematous bumps approx 0.5-1.0 cm in diameter. Excoriations noted.   Psychiatric: She has a normal mood and affect.    ED Course  Procedures (including critical care time) Medications  diphenhydrAMINE (BENADRYL) capsule 50 mg (50 mg Oral Given 05/07/13 1322)  famotidine (PEPCID) tablet 20 mg (20 mg Oral Given 05/07/13 1321)  predniSONE (DELTASONE) tablet 60 mg (60 mg Oral Given 05/07/13 1320)    Labs Reviewed - No data to display No results found.   1. Insect bite of lower back with local reaction, initial encounter       MDM  Did not appreciate pt itching on physical exam. Several small, raised, mildly edematous, erythematous bumps approx 0.5-1.0 cm in diameter. Excoriations noted. Patient re-evaluated prior to dc, is hemodynamically stable, in no respiratory distress, and denies the feeling of throat closing.  Experiencing relief after benadryl, pepcid, prednisone. Will prescribe same with instructions to return to the ED if she has a mod-severe allergic rxn (s/s including throat closing, difficulty breathing, swelling of lips face or tongue). Pt is to follow up with their PCP. Pt is agreeable with plan & verbalizes understanding.      Glade Nurse, PA-C 05/07/13 1551

## 2013-05-07 NOTE — ED Notes (Signed)
Pt reports insect bite to her lower back over the weekend while out in the garden. Pt reports area is itchy and painful. Pt reports hx of abcess.

## 2013-06-20 ENCOUNTER — Emergency Department (HOSPITAL_COMMUNITY)
Admission: EM | Admit: 2013-06-20 | Discharge: 2013-06-20 | Disposition: A | Discharge status: Home / Self Care | Payer: Medicaid Other | Attending: Emergency Medicine | Admitting: Emergency Medicine

## 2013-06-20 DIAGNOSIS — Z8669 Personal history of other diseases of the nervous system and sense organs: Secondary | ICD-10-CM | POA: Insufficient documentation

## 2013-06-20 DIAGNOSIS — M25579 Pain in unspecified ankle and joints of unspecified foot: Secondary | ICD-10-CM | POA: Insufficient documentation

## 2013-06-20 DIAGNOSIS — Z79899 Other long term (current) drug therapy: Secondary | ICD-10-CM | POA: Insufficient documentation

## 2013-06-20 DIAGNOSIS — M25572 Pain in left ankle and joints of left foot: Secondary | ICD-10-CM

## 2013-06-20 DIAGNOSIS — M25476 Effusion, unspecified foot: Secondary | ICD-10-CM | POA: Insufficient documentation

## 2013-06-20 DIAGNOSIS — M722 Plantar fascial fibromatosis: Secondary | ICD-10-CM | POA: Insufficient documentation

## 2013-06-20 DIAGNOSIS — M25473 Effusion, unspecified ankle: Secondary | ICD-10-CM | POA: Insufficient documentation

## 2013-06-20 DIAGNOSIS — Z8679 Personal history of other diseases of the circulatory system: Secondary | ICD-10-CM | POA: Insufficient documentation

## 2013-06-20 DIAGNOSIS — Z862 Personal history of diseases of the blood and blood-forming organs and certain disorders involving the immune mechanism: Secondary | ICD-10-CM | POA: Insufficient documentation

## 2013-06-20 MED ORDER — NAPROXEN 500 MG PO TABS: 500.0000 mg | ORAL_TABLET | Freq: Two times a day (BID) | ORAL | Status: DC

## 2013-06-20 NOTE — ED Notes (Signed)
Pt states she sprained her L ankle about a month ago. Pt states she elevated it as much as she could, but it is still swollen and painful. 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 Pt also states she has pain in her L heel. Pt has taken Motrin and Vicodin for pain, but it is not helping much. Pt ambulatory to exam room with steady gait. Pt arrives with family member.

## 2013-06-20 NOTE — ED Provider Notes (Signed)
History    This chart was scribed for non-physician practitioner Magnus Sinning, PA-C, working with Gilda Crease, by Donne Anon, ED Scribe. This patient was seen in room WTR6/WTR6 and the patient's care was started at 2126.  CSN: [id number] Arrival date & time 06/20/13  2117  First MD Initiated Contact with Patient 06/20/13 2126     Chief Complaint  Patient presents with  . Ankle Pain    The history is provided by the patient. No language interpreter was used.   HPI Comments: Anne Ray is a 22 y.o. female who presents to the Emergency Department complaining of 1 month of sudden onset, gradually worsening, waxing and waning, diffuse left ankle pain and left heel pain described as sharp. She states she sprained her ankle 1 month ago, but has not sought medical attention for this complaint until now. She reports associated swelling, which has not been relieved by elevation. She has also tried Epson salt soaks. 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 Standing makes the pain worse. She states she has slightly re-twisted the ankle a few times within the past month. She has tried Motrin and Vicodin with little relief. She denies surrounding redness or any other pain or injury.  Denies fever or chills.  She has full ROM of the foot and ankle.    Past Medical History  Diagnosis Date  . Migraine   . Carpal tunnel syndrome   . Sickle cell trait    Past Surgical History  Procedure Laterality Date  . No past surgeries    . Vascular surgery     Family History  Problem Relation Age of Onset  . Diabetes Other    History  Substance Use Topics  . Smoking status: Never Smoker   . Smokeless tobacco: Not on file  . Alcohol Use: No   OB History   Grav Para Term Preterm Abortions TAB SAB Ect Mult Living   1 1 1       1      Review of Systems  Musculoskeletal: Positive for joint swelling and arthralgias.  All other systems reviewed and are  negative.    Allergies  Kiwi extract  Home Medications   Current Outpatient Rx  Name  Route  Sig  Dispense  Refill  . Biotin 1000 MCG tablet   Oral   Take 1,000 mcg by mouth daily.         . diphenhydrAMINE (BENADRYL) 25 MG tablet   Oral   Take 1 tablet (25 mg total) by mouth every 6 (six) hours as needed for itching (Rash).   30 tablet   0   . famotidine (PEPCID) 20 MG tablet      Take 1 tablet (20 mg total) by mouth 2 (two) times daily for 5 days (can purchase over the counter)   10 tablet   0   . medroxyPROGESTERone (DEPO-PROVERA) 150 MG/ML injection   Intramuscular   Inject 150 mg into the muscle every 3 (three) months. Last dose 04/06/2013.         . predniSONE (DELTASONE) 20 MG tablet   Oral   Take 2 tablets (40 mg total) by mouth daily.   10 tablet   0   . triamcinolone cream (KENALOG) 0.1 %   Topical   Apply 1 application topically daily as needed (for eczema).          BP 131/88  Pulse 91  Temp(Src)  99 F (37.2 C) (Oral)  Resp 16  SpO2 100%  Physical Exam  Nursing note and vitals reviewed. Constitutional: She appears well-developed and well-nourished. No distress.  Morbidly obese  HENT:  Head: Normocephalic and atraumatic.  Eyes: Conjunctivae are normal.  Neck: Neck supple. No tracheal deviation present.  Cardiovascular: Normal rate, regular rhythm and normal heart sounds.   Pulses:      Dorsalis pedis pulses are 2+ on the right side, and 2+ on the left side.  Pulmonary/Chest: Effort normal and breath sounds normal. No respiratory distress.  Musculoskeletal: Normal range of motion.       Left ankle: She exhibits swelling. She exhibits normal range of motion, no ecchymosis, no deformity and normal pulse. Tenderness. Lateral malleolus and medial malleolus tenderness found.  Tender to palpation of the lateral and medial malleolus of left ankle, with more pain along the lateral aspect. No erythema present. No pitting edema.   Tenderness to  palpation of the left plantar fascia.    Neurological: She is alert. No sensory deficit. Gait normal.  Skin: Skin is warm and dry.  No erythema or warmth of the left ankle or foot.  Psychiatric: She has a normal mood and affect. Her behavior is normal.    ED Course  Procedures (including critical care time) DIAGNOSTIC STUDIES: Oxygen Saturation is 100% on RA, normal by my interpretation.    COORDINATION OF CARE: 9:45 PM Discussed treatment plan which includes ibuprofen, pain medication and ankle brace with pt at bedside and pt agreed to plan. Advised pt to wear shoes with more support and to get orthotics. RICE protocol discussed.    Labs Reviewed - No data to display No results found. No diagnosis found.  MDM  Patient presents with left foot pain and also ankle pain.  No acute injury or trauma.  Patient ambulating without difficulty.  No signs of infection.  Patient is morbidly obese and is on her feet a lot as a waitress, which is most likely contributing to the pain.  Foot pain located over the Plantar Fascia.  Therefore, feel that pain could be secondary to Plantar Fascitis.  Patient instructed to take NSAIDs for her pain, elevate her leg, and use insoles in her shoes.  I personally performed the services described in this documentation, which was scribed in my presence. The recorded information has been reviewed and is accurate.    Pascal Lux [place]worth, PA-C 06/22/13 [phone]

## 2013-06-20 NOTE — ED Notes (Signed)
Ortho tech called for placement of ASO splint.

## 2013-06-25 NOTE — ED Provider Notes (Signed)
Medical screening examination/treatment/procedure(s) were performed by non-physician practitioner and as supervising physician I was immediately available for consultation/collaboration.    Gilda Crease, MD 06/25/13 [phone]

## 2013-09-29 ENCOUNTER — Inpatient Hospital Stay (HOSPITAL_COMMUNITY)
Admission: AD | Admit: 2013-09-29 | Discharge: 2013-09-29 | Disposition: A | Discharge status: Home / Self Care | Payer: BC Managed Care – PPO | Source: Ambulatory Visit | Attending: Obstetrics and Gynecology | Admitting: Obstetrics and Gynecology

## 2013-09-29 ENCOUNTER — Encounter (HOSPITAL_COMMUNITY): Payer: Self-pay | Admitting: *Deleted

## 2013-09-29 DIAGNOSIS — N946 Dysmenorrhea, unspecified: Secondary | ICD-10-CM

## 2013-09-29 DIAGNOSIS — N938 Other specified abnormal uterine and vaginal bleeding: Secondary | ICD-10-CM | POA: Insufficient documentation

## 2013-09-29 DIAGNOSIS — N949 Unspecified condition associated with female genital organs and menstrual cycle: Secondary | ICD-10-CM | POA: Insufficient documentation

## 2013-09-29 DIAGNOSIS — N921 Excessive and frequent menstruation with irregular cycle: Secondary | ICD-10-CM

## 2013-09-29 DIAGNOSIS — R109 Unspecified abdominal pain: Secondary | ICD-10-CM | POA: Insufficient documentation

## 2013-09-29 LAB — URINALYSIS, ROUTINE W REFLEX MICROSCOPIC
Bilirubin Urine: NEGATIVE
Glucose, UA: NEGATIVE mg/dL
Leukocytes, UA: NEGATIVE
Nitrite: NEGATIVE
Protein, ur: NEGATIVE mg/dL
Specific Gravity, Urine: 1.02 (ref 1.005–1.030)
pH: 6 (ref 5.0–8.0)

## 2013-09-29 LAB — URINE MICROSCOPIC-ADD ON

## 2013-09-29 LAB — WET PREP, GENITAL
Clue Cells Wet Prep HPF POC: NONE SEEN
Trich, Wet Prep: NONE SEEN
Yeast Wet Prep HPF POC: NONE SEEN

## 2013-09-29 MED ORDER — KETOROLAC TROMETHAMINE 60 MG/2ML IM SOLN
60.0000 mg | Freq: Once | INTRAMUSCULAR | Status: AC
  Administered 2013-09-29: 60 mg via INTRAMUSCULAR
  Filled 2013-09-29: qty 2

## 2013-09-29 MED ORDER — OXYCODONE-ACETAMINOPHEN 5-325 MG PO TABS: 1.0000 | ORAL_TABLET | ORAL | Status: DC | PRN

## 2013-09-29 MED ORDER — IBUPROFEN 800 MG PO TABS: 800.0000 mg | ORAL_TABLET | Freq: Three times a day (TID) | ORAL | Status: DC | PRN

## 2013-09-29 NOTE — Progress Notes (Signed)
Constant pain at 8 and then worse cramping is 10

## 2013-09-29 NOTE — MAU Provider Note (Signed)
History     CSN: [id number]  Arrival date and time: 09/29/13 1191   First Provider Initiated Contact with Patient 09/29/13 2004      Chief Complaint  Patient presents with  . Abdominal Pain   HPI [name] is a 22 y.o. G1P1001 who presents to MAU today with complaint of lower abdominal pain and spotting. The patient states that spotting started on Thursday night after she noted some some clear discharge. The patient states that the lower abdominal pain started on Friday and became worse today. She rates her pain at 8/10 now. She took Ibuprofen last night and took percocet or lortab today around 1400. The patient is on Depo Provera and has been since 2006. She states that she has had 2 SQ injections for her last two injections and noted some breakthrough bleeding in both of those cycles. She otherwise had minimal occasional spotting. She denies UTI symptoms, N/V/D or constipation or fever. She is sexually active and uses condoms.   OB History   Grav Para Term Preterm Abortions TAB SAB Ect Mult Living   1 1 1       1       Past Medical History  Diagnosis Date  . Migraine   . Carpal tunnel syndrome   . Sickle cell trait     Past Surgical History  Procedure Laterality Date  . No past surgeries    . Vascular surgery      Family History  Problem Relation Age of Onset  . Diabetes Other     History  Substance Use Topics  . Smoking status: Never Smoker   . Smokeless tobacco: Not on file  . Alcohol Use: No    Allergies:  Allergies  Allergen Reactions  . Kiwi Extract Other (See Comments)    "tongue tingling"    No prescriptions prior to admission    Review of Systems  Constitutional: Negative for fever and malaise/fatigue.  Gastrointestinal: Positive for abdominal pain. Negative for nausea, vomiting, diarrhea and constipation.  Genitourinary: Negative for dysuria, urgency and frequency.       + vaginal discharge, bleeding  Neurological: Negative for  dizziness.   Physical Exam   Blood pressure 98/64, pulse 69, temperature 98.6 F (37 C), resp. rate 18, height 5\' 8"  (1.727 m), weight 329 lb 12.8 oz (149.596 kg), SpO2 100.00%.  Physical Exam  Constitutional: She is oriented to person, place, and time. She appears well-developed and well-nourished. No distress.  HENT:  Head: Normocephalic and atraumatic.  Cardiovascular: Normal rate, regular rhythm and normal heart sounds.   Respiratory: Effort normal and breath sounds normal. No respiratory distress.  GI: Soft. Bowel sounds are normal. She exhibits no distension and no mass. There is no tenderness. There is no rebound and no guarding.  Genitourinary: Uterus is not enlarged (exam limited by body habitus) and not tender. Cervix exhibits no motion tenderness, no discharge and no friability. Right adnexum displays no mass and no tenderness. Left adnexum displays no mass and no tenderness. There is bleeding (small amount of blood noted in the vagina at the cervical os) around the vagina. No vaginal discharge found.  Neurological: She is alert and oriented to person, place, and time.  Skin: Skin is warm and dry. No erythema.  Psychiatric: She has a normal mood and affect.   Results for orders placed during the hospital encounter of 09/29/13 (from the past 24 hour(s))  WET PREP, GENITAL     Status: Abnormal  Collection Time    09/29/13  8:18 PM      Result Value Range   Yeast Wet Prep HPF POC NONE SEEN  NONE SEEN   Trich, Wet Prep NONE SEEN  NONE SEEN   Clue Cells Wet Prep HPF POC NONE SEEN  NONE SEEN   WBC, Wet Prep HPF POC FEW (*) NONE SEEN  URINALYSIS, ROUTINE W REFLEX MICROSCOPIC     Status: Abnormal   Collection Time    09/29/13  8:47 PM      Result Value Range   Color, Urine YELLOW  YELLOW   APPearance CLEAR  CLEAR   Specific Gravity, Urine 1.020  1.005 - 1.030   pH 6.0  5.0 - 8.0   Glucose, UA NEGATIVE  NEGATIVE mg/dL   Hgb urine dipstick LARGE (*) NEGATIVE   Bilirubin Urine  NEGATIVE  NEGATIVE   Ketones, ur NEGATIVE  NEGATIVE mg/dL   Protein, ur NEGATIVE  NEGATIVE mg/dL   Urobilinogen, UA 0.2  0.0 - 1.0 mg/dL   Nitrite NEGATIVE  NEGATIVE   Leukocytes, UA NEGATIVE  NEGATIVE  URINE MICROSCOPIC-ADD ON     Status: None   Collection Time    09/29/13  8:47 PM      Result Value Range   Squamous Epithelial / LPF RARE  RARE   WBC, UA 0-2  <3 WBC/hpf   RBC / HPF 21-50  <3 RBC/hpf   Bacteria, UA RARE  RARE  POCT PREGNANCY, URINE     Status: None   Collection Time    09/29/13  8:49 PM      Result Value Range   Preg Test, Ur NEGATIVE  NEGATIVE    MAU Course  Procedures None  MDM UPT UA, Wet prep, GC/Chlmydia today  Assessment and Plan  A: Dysmenorrhea Breakthrough bleeding with Depo Provera  P: Discharge home Rx for Percocet and Ibuprofen given to patient Patient advised to follow-up with [facility] as scheduled to discuss other birth control options AVS contains information about birth control options Patient may return to MAU as needed or if her condition were to change or worsen  Freddi Starr, PA-C  09/29/2013, 9:30 PM

## 2013-09-29 NOTE — MAU Note (Signed)
Thurs night had small amt d/c and abd pain. That night started bleeding. Been on Depo since 06 and next shot due Nov 7th. Still having spotting and cramping. Took Percocet and not helping. Ibuprofen not helping and been in bed all day.

## 2013-09-30 NOTE — MAU Provider Note (Signed)
Attestation of Attending Supervision of Advanced Practitioner (CNM/NP): Evaluation and management procedures were performed by the Advanced Practitioner under my supervision and collaboration.  I have reviewed the Advanced Practitioner's note and chart, and I agree with the management and plan.  Fed Ceci 09/30/2013 3:53 PM

## 2013-10-02 LAB — GC/CHLAMYDIA PROBE AMP: GC Probe RNA: NEGATIVE

## 2013-12-01 ENCOUNTER — Emergency Department (HOSPITAL_COMMUNITY)
Admission: EM | Admit: 2013-12-01 | Discharge: 2013-12-01 | Disposition: A | Discharge status: Home / Self Care | Payer: BC Managed Care – PPO | Attending: Emergency Medicine | Admitting: Emergency Medicine

## 2013-12-01 ENCOUNTER — Encounter (HOSPITAL_COMMUNITY): Payer: Self-pay | Admitting: Emergency Medicine

## 2013-12-01 DIAGNOSIS — R519 Headache, unspecified: Secondary | ICD-10-CM

## 2013-12-01 DIAGNOSIS — G43909 Migraine, unspecified, not intractable, without status migrainosus: Secondary | ICD-10-CM | POA: Insufficient documentation

## 2013-12-01 DIAGNOSIS — R51 Headache: Secondary | ICD-10-CM

## 2013-12-01 DIAGNOSIS — Z862 Personal history of diseases of the blood and blood-forming organs and certain disorders involving the immune mechanism: Secondary | ICD-10-CM | POA: Insufficient documentation

## 2013-12-01 MED ORDER — METOCLOPRAMIDE HCL 5 MG/ML IJ SOLN
10.0000 mg | Freq: Once | INTRAMUSCULAR | Status: AC
  Administered 2013-12-01: 10 mg via INTRAMUSCULAR
  Filled 2013-12-01: qty 2

## 2013-12-01 MED ORDER — DEXAMETHASONE SODIUM PHOSPHATE 10 MG/ML IJ SOLN
10.0000 mg | Freq: Once | INTRAMUSCULAR | Status: AC
  Administered 2013-12-01: 10 mg via INTRAMUSCULAR
  Filled 2013-12-01: qty 1

## 2013-12-01 MED ORDER — KETOROLAC TROMETHAMINE 60 MG/2ML IM SOLN
60.0000 mg | Freq: Once | INTRAMUSCULAR | Status: AC
  Administered 2013-12-01: 60 mg via INTRAMUSCULAR
  Filled 2013-12-01: qty 2

## 2013-12-01 MED ORDER — DIPHENHYDRAMINE HCL 50 MG/ML IJ SOLN
25.0000 mg | Freq: Once | INTRAMUSCULAR | Status: AC
  Administered 2013-12-01: 25 mg via INTRAMUSCULAR
  Filled 2013-12-01: qty 1

## 2013-12-01 NOTE — ED Notes (Signed)
Pt reports headache x 2 days, hx of migraines and no relief with excedrin. Denies n/v but is having dizziness. No acute distress noted at triage.

## 2013-12-01 NOTE — ED Provider Notes (Signed)
Medical screening examination/treatment/procedure(s) were performed by non-physician practitioner and as supervising physician I was immediately available for consultation/collaboration.  EKG Interpretation   None         Melvia Matousek, MD 12/01/13 1533 

## 2013-12-01 NOTE — Discharge Instructions (Signed)

## 2013-12-01 NOTE — ED Provider Notes (Signed)
CSN: [id number]     Arrival date & time 12/01/13  1127 History   First MD Initiated Contact with Patient 12/01/13 1135     Chief Complaint  Patient presents with  . Headache   (Consider location/radiation/quality/duration/timing/severity/associated sxs/prior Treatment) HPI Comments: Pt state that she has a history of migraines:pt states that the symptoms started about a week ago after a smell triggered the migraine:pt states that she has not had n/v, fever, or cold symptoms:pt states that this is similar to previous migraine she just can't get it under control with excedrin migraine:light makes the headache worse  The history is provided by the patient. No language interpreter was used.    Past Medical History  Diagnosis Date  . Migraine   . Carpal tunnel syndrome   . Sickle cell trait    Past Surgical History  Procedure Laterality Date  . No past surgeries    . Vascular surgery     Family History  Problem Relation Age of Onset  . Diabetes Other    History  Substance Use Topics  . Smoking status: Never Smoker   . Smokeless tobacco: Not on file  . Alcohol Use: No   OB History   Grav Para Term Preterm Abortions TAB SAB Ect Mult Living   1 1 1       1      Review of Systems  Constitutional: Negative.   Eyes: Negative for photophobia.  Respiratory: Negative.   Cardiovascular: Negative.     Allergies  Kiwi extract  Home Medications   Current Outpatient Rx  Name  Route  Sig  Dispense  Refill  . aspirin-acetaminophen-caffeine (EXCEDRIN MIGRAINE) 250-250-65 MG per tablet   Oral   Take 1-2 tablets by mouth every 6 (six) hours as needed for headache or migraine.         . medroxyPROGESTERone (DEPO-PROVERA) 150 MG/ML injection   Intramuscular   Inject 150 mg into the muscle every 3 (three) months. Last dose 04/06/2013.          BP 130/66  Pulse 78  Temp(Src) 98.3 F (36.8 C) (Oral)  Resp 16  Wt 329 lb 9.6 oz (149.506 kg)  SpO2 99% Physical Exam  Nursing note  and vitals reviewed. Constitutional: She is oriented to person, place, and time. She appears well-developed and well-nourished.  HENT:  Head: Normocephalic and atraumatic.  Right Ear: External ear normal.  Left Ear: External ear normal.  Eyes: Conjunctivae and EOM are normal.  Cardiovascular: Normal rate and regular rhythm.   Pulmonary/Chest: Effort normal and breath sounds normal.  Musculoskeletal: Normal range of motion.       Cervical back: Normal.  Neurological: She is alert and oriented to person, place, and time. Coordination normal.  Skin: Skin is warm and dry.  Psychiatric: She has a normal mood and affect.    ED Course  Procedures (including critical care time) Labs Review Labs Reviewed - No data to display Imaging Review No results found.  EKG Interpretation   None       MDM   1. Headache    Pain is tolerable for pt and she is ready to go home:headache is similar to previous migraines    Teressa LowerVrinda Cylee Dattilo, NP 12/01/13 1404

## 2014-05-19 ENCOUNTER — Encounter (HOSPITAL_COMMUNITY): Payer: Self-pay | Admitting: Emergency Medicine

## 2014-05-19 ENCOUNTER — Emergency Department (HOSPITAL_COMMUNITY)
Admission: EM | Admit: 2014-05-19 | Discharge: 2014-05-19 | Disposition: A | Discharge status: Home / Self Care | Payer: BC Managed Care – PPO | Attending: Emergency Medicine | Admitting: Emergency Medicine

## 2014-05-19 DIAGNOSIS — M79601 Pain in right arm: Secondary | ICD-10-CM

## 2014-05-19 DIAGNOSIS — R209 Unspecified disturbances of skin sensation: Secondary | ICD-10-CM | POA: Insufficient documentation

## 2014-05-19 DIAGNOSIS — M79609 Pain in unspecified limb: Secondary | ICD-10-CM | POA: Insufficient documentation

## 2014-05-19 DIAGNOSIS — G43909 Migraine, unspecified, not intractable, without status migrainosus: Secondary | ICD-10-CM | POA: Insufficient documentation

## 2014-05-19 DIAGNOSIS — Z862 Personal history of diseases of the blood and blood-forming organs and certain disorders involving the immune mechanism: Secondary | ICD-10-CM | POA: Insufficient documentation

## 2014-05-19 MED ORDER — MELOXICAM 7.5 MG PO TABS: 7.5000 mg | ORAL_TABLET | Freq: Every day | ORAL | Status: DC

## 2014-05-19 MED ORDER — HYDROCODONE-ACETAMINOPHEN 5-325 MG PO TABS: ORAL_TABLET | ORAL | Status: DC

## 2014-05-19 MED ORDER — PREDNISONE 20 MG PO TABS
60.0000 mg | ORAL_TABLET | Freq: Once | ORAL | Status: AC
  Administered 2014-05-19: 60 mg via ORAL
  Filled 2014-05-19: qty 3

## 2014-05-19 MED ORDER — PREDNISONE 20 MG PO TABS: ORAL_TABLET | ORAL | Status: DC

## 2014-05-19 NOTE — ED Notes (Signed)
Declined W/C at D/C and was escorted to lobby by RN. 

## 2014-05-19 NOTE — ED Provider Notes (Signed)
CSN: [id number]     Arrival date & time 05/19/14  1936 History  This chart was scribed for Renne CriglerJoshua Geiple, PA-C working with Hilario Quarryanielle S Ray, MD by Evon Slackerrance Branch, ED Scribe. This patient was seen in room TR04C/TR04C and the patient's care was started at 7:55 PM.  Chief Complaint  Patient presents with  . Wrist Pain   Patient is a 23 y.o. female presenting with wrist pain. The history is provided by the patient. No language interpreter was used.  Wrist Pain   HPI Comments: Anne Ray is a 23 y.o. female who presents to the Emergency Department complaining of right shoulder, arm, wrist pain. She states she has associated shooting pain throughout her right arm. She states she has been taking 800mg  ibuprofen and 2 percocet with no relief. She states she has a h/o of carpel tunnel that she was getting injections. She states her last injection was over 2 months ago.   She states her hand is having a tingling feeling. She states that yesterday at work when she tried to lift a glass she was not able to lift due to the pain in her hand. She feels weak in the hand. She states she was having shooting pains from her fingers up into her arm. She denies weakness of lower extremities, numbness, urinary/ bowel incontinence.    Past Medical History  Diagnosis Date  . Migraine   . Carpal tunnel syndrome   . Sickle cell trait    Past Surgical History  Procedure Laterality Date  . No past surgeries    . Vascular surgery     Family History  Problem Relation Age of Onset  . Diabetes Other    History  Substance Use Topics  . Smoking status: Never Smoker   . Smokeless tobacco: Not on file  . Alcohol Use: No   OB History   Grav Para Term Preterm Abortions TAB SAB Ect Mult Living   1 1 1       1      Review of Systems  Constitutional: Negative for fever and unexpected weight change.  Gastrointestinal: Negative for constipation.       Negative for fecal incontinence.   Genitourinary:  Negative for dysuria and flank pain.       Negative for urinary incontinence or retention.  Musculoskeletal: Positive for arthralgias. Negative for back pain.  Neurological: Positive for weakness and numbness (tingling, no complete numbess).       Denies saddle paresthesias.    Allergies  Kiwi extract  Home Medications   Prior to Admission medications   Medication Sig Start Date End Date Taking? Authorizing Provider  aspirin-acetaminophen-caffeine (EXCEDRIN MIGRAINE) [id number] MG per tablet Take 1-2 tablets by mouth every 6 (six) hours as needed for headache or migraine.    Historical Provider, MD  medroxyPROGESTERone (DEPO-PROVERA) 150 MG/ML injection Inject 150 mg into the muscle every 3 (three) months. Last dose 04/06/2013.    Historical Provider, MD   Triage Vitals: BP 123/65  Pulse 95  Temp(Src) 99.4 F (37.4 C) (Oral)  Resp 18  Ht 5\' 9"  (1.753 m)  Wt 337 lb 7 oz (153.061 kg)  BMI 49.81 kg/m2  SpO2 97%  Physical Exam  Nursing note and vitals reviewed. Constitutional: She appears well-developed and well-nourished. No distress.  HENT:  Head: Normocephalic and atraumatic.  Eyes: Conjunctivae and EOM are normal. Pupils are equal, round, and reactive to light.  Neck: Normal range of motion. Neck supple. No tracheal deviation present.  Cardiovascular: Normal rate.  Exam reveals no decreased pulses.   Pulmonary/Chest: Effort normal. No respiratory distress.  Musculoskeletal: She exhibits tenderness. She exhibits no edema.       Right shoulder: She exhibits decreased range of motion and tenderness. She exhibits no bony tenderness and no deformity.       Right elbow: Normal.      Right wrist: She exhibits tenderness. She exhibits normal range of motion and no bony tenderness.       Cervical back: Normal. She exhibits normal range of motion, no tenderness and no bony tenderness.       Right upper arm: Normal.       Right forearm: Normal.       Right hand: She exhibits decreased  range of motion and tenderness. She exhibits no bony tenderness. Decreased sensation (all distributions) noted. Decreased strength noted. She exhibits finger abduction and thumb/finger opposition. She exhibits no wrist extension trouble.  Neurological: She is alert. No sensory deficit.  Motor, sensation, and vascular distal to the injury is fully intact.   Skin: Skin is warm and dry.  Psychiatric: She has a normal mood and affect. Her behavior is normal.    ED Course  Procedures (including critical care time) DIAGNOSTIC STUDIES: Oxygen Saturation is 97% on RA, normal by my interpretation.    COORDINATION OF CARE:    Labs Review Labs Reviewed - No data to display  Imaging Review No results found.   EKG Interpretation None      Vital signs reviewed and are as follows: Filed Vitals:   05/19/14 1941  BP: 123/65  Pulse: 95  Temp: 99.4 F (37.4 C)  Resp: 18   Will start patient on oral steroids, Mobic, Vicodin. Patient has orthopedic physician and she is asked to call for followup appointment this week.   Patient urged to return with worsening symptoms or other concerns. Patient verbalized understanding and agrees with plan.     MDM   Final diagnoses:  Pain of right upper extremity   Patient with upper extremity pain and hand weakness and tingling. Patient has history of carpal tunnel syndrome however she does not typically have these upper extremity symptoms. Cannot rule out cervical radiculopathy or brachial plexus etiology. Cannot rule out rotator cuff injury as well, although pain seems more neuropathic in nature. Upper extremity is vascularly intact. No lower extremity symptoms to suggest central cord syndrome. Patient has good orthopedic followup. Will test to control symptoms with pain medication, NSAIDs, and oral steroids.  I personally performed the services described in this documentation, which was scribed in my presence. The recorded information has been  reviewed and is accurate.      Renne CriglerJoshua Geiple, PA-C 05/19/14 2022

## 2014-05-19 NOTE — Discharge Instructions (Signed)
Please read and follow all provided instructions.  Your diagnoses today include:  1. Pain of right upper extremity     Tests performed today include:  Vital signs - see below for your results today  Medications prescribed:   Vicodin (hydrocodone/acetaminophen) - narcotic pain medication  DO NOT drive or perform any activities that require you to be awake and alert because this medicine can make you drowsy. BE VERY CAREFUL not to take multiple medicines containing Tylenol (also called acetaminophen). Doing so can lead to an overdose which can damage your liver and cause liver failure and possibly death.   Prednisone - steroid medicine   It is best to take this medication in the morning to prevent sleeping problems. If you are diabetic, monitor your blood sugar closely and stop taking Prednisone if blood sugar is over 300. Take with food to prevent stomach upset.    Meloxicam - anti-inflammatory pain medication  You have been prescribed an anti-inflammatory medication or NSAID. Take with food. Do not take aspirin, ibuprofen, or naproxen if taking this medication. Take smallest effective dose for the shortest duration needed for your pain. Stop taking if you experience stomach pain or vomiting.   Take any prescribed medications only as directed.  Home care instructions:   Follow any educational materials contained in this packet  Please rest, use ice or heat on your back for the next several days  Do not lift, push, pull anything more than 10 pounds for the next week  Follow-up instructions: Please follow-up with your orthopedist this week.   Return instructions:  SEEK IMMEDIATE MEDICAL ATTENTION IF YOU HAVE:  New numbness, tingling, weakness, or problem with the use of your arms or legs  Severe back pain not relieved with medications  Loss control of your bowels or bladder  Increasing pain in any areas of the body (such as chest or abdominal pain)  Shortness of breath,  dizziness, or fainting.   Worsening nausea (feeling sick to your stomach), vomiting, fever, or sweats  Any other emergent concerns regarding your health   Additional Information:  Your vital signs today were: BP 123/65   Pulse 95   Temp(Src) 99.4 F (37.4 C) (Oral)   Resp 18   Ht 5\' 9"  (1.753 m)   Wt 337 lb 7 oz (153.061 kg)   BMI 49.81 kg/m2   SpO2 97% If your blood pressure (BP) was elevated above 135/85 this visit, please have this repeated by your doctor within one month. --------------

## 2014-05-19 NOTE — ED Notes (Signed)
The pt is c/o bi-lateral wrist pain she has been diagnosed with carpal tunnel .  The rt wrist is worse than the lt.  She wears splints at night but that is not helping.  The fingers on her rt hand have been numb since 1400 today and she ha pain going up her arm and into her shoulder.  lmp none on depo

## 2014-05-24 NOTE — ED Provider Notes (Signed)
History/physical exam/procedure(s) were performed by non-physician practitioner and as supervising physician I was immediately available for consultation/collaboration. I have reviewed all notes and am in agreement with care and plan.   Hilario Quarryanielle S Ray, MD 05/24/14 1031

## 2014-08-06 ENCOUNTER — Encounter (HOSPITAL_COMMUNITY): Payer: Self-pay | Admitting: Emergency Medicine

## 2014-08-06 ENCOUNTER — Emergency Department (HOSPITAL_COMMUNITY): Payer: BC Managed Care – PPO

## 2014-08-06 ENCOUNTER — Emergency Department (HOSPITAL_COMMUNITY)
Admission: EM | Admit: 2014-08-06 | Discharge: 2014-08-06 | Disposition: A | Discharge status: Home / Self Care | Payer: BC Managed Care – PPO | Attending: Emergency Medicine | Admitting: Emergency Medicine

## 2014-08-06 DIAGNOSIS — Z8669 Personal history of other diseases of the nervous system and sense organs: Secondary | ICD-10-CM | POA: Insufficient documentation

## 2014-08-06 DIAGNOSIS — Z8679 Personal history of other diseases of the circulatory system: Secondary | ICD-10-CM | POA: Diagnosis not present

## 2014-08-06 DIAGNOSIS — Z3202 Encounter for pregnancy test, result negative: Secondary | ICD-10-CM | POA: Insufficient documentation

## 2014-08-06 DIAGNOSIS — Z862 Personal history of diseases of the blood and blood-forming organs and certain disorders involving the immune mechanism: Secondary | ICD-10-CM | POA: Insufficient documentation

## 2014-08-06 DIAGNOSIS — R079 Chest pain, unspecified: Secondary | ICD-10-CM | POA: Diagnosis present

## 2014-08-06 DIAGNOSIS — R0789 Other chest pain: Secondary | ICD-10-CM | POA: Diagnosis not present

## 2014-08-06 DIAGNOSIS — E669 Obesity, unspecified: Secondary | ICD-10-CM | POA: Diagnosis not present

## 2014-08-06 LAB — BASIC METABOLIC PANEL
ANION GAP: 15 (ref 5–15)
BUN: 6 mg/dL (ref 6–23)
CALCIUM: 9.2 mg/dL (ref 8.4–10.5)
CHLORIDE: 105 meq/L (ref 96–112)
CO2: 19 mEq/L (ref 19–32)
CREATININE: 0.66 mg/dL (ref 0.50–1.10)
GFR calc non Af Amer: >90 mL/min (ref >90)
Glucose, Bld: 79 mg/dL (ref 70–99)
Potassium: 3.9 mEq/L (ref 3.7–5.3)
Sodium: 139 mEq/L (ref 137–147)

## 2014-08-06 LAB — POC URINE PREG, ED: Preg Test, Ur: NEGATIVE

## 2014-08-06 LAB — CBC
HCT: 42.2 % (ref 36.0–46.0)
Hemoglobin: 14.4 g/dL (ref 12.0–15.0)
MCH: 28.3 pg (ref 26.0–34.0)
MCHC: 34.1 g/dL (ref 30.0–36.0)
MCV: 82.9 fL (ref 78.0–100.0)
PLATELETS: 219 10*3/uL (ref 150–400)
RBC: 5.09 MIL/uL (ref 3.87–5.11)
RDW: 14.2 % (ref 11.5–15.5)
WBC: 10.5 10*3/uL (ref 4.0–10.5)

## 2014-08-06 LAB — I-STAT TROPONIN, ED: Troponin i, poc: 0.01 ng/mL (ref 0.00–0.08)

## 2014-08-06 MED ORDER — HYDROCODONE-ACETAMINOPHEN 5-325 MG PO TABS: 2.0000 | ORAL_TABLET | ORAL | Status: DC | PRN

## 2014-08-06 NOTE — Discharge Instructions (Signed)
Return to the emergency room with worsening of symptoms, new symptoms or with symptoms that are concerning, especially worsening chest pain, pain worse with activity, difficulty breathing, nausea, vomiting. Please call your doctor for a followup appointment within 24-48 hours. When you talk to your doctor please let them know that you were seen in the emergency department and have them acquire all of your records so that they can discuss the findings with you and formulate a treatment plan to fully care for your new and ongoing problems. To establish care with PCP use ED resources below. RICE: Rest, Ice (three cycles of 20 mins on, off at least twice a day), compression/brace, elevation. Heating pad works well for back pain. Ibuprofen  (3 tablets ) every 5-6 hours for 3-5 days and then as needed for pain. Short course of narcotics. Do not operate machinery, drive or drink alcohol while taking narcotics or muscle relaxants. Follow up with PCP if symptoms worsen or are persistent.     Emergency Department Resource Guide 1) Find a Doctor and Pay Out of Pocket Although you won't have to find out who is covered by your insurance plan, it is a good idea to ask around and get recommendations. You will then need to call the office and see if the doctor you have chosen will accept you as a new patient and what types of options they offer for patients who are self-pay. Some doctors offer discounts or will set up payment plans for their patients who do not have insurance, but you will need to ask so you aren't surprised when you get to your appointment.  2) Contact Your Local Health Department Not all health departments have doctors that can see patients for sick visits, but many do, so it is worth a call to see if yours does. If you don't know where your local health department is, you can check in your phone book. The CDC also has a tool to help you locate your state's health department, and  many state websites also have listings of all of their local health departments.  3) Find a Walk-in Clinic If your illness is not likely to be very severe or complicated, you may want to try a walk in clinic. These are popping up all over the country in pharmacies, drugstores, and shopping centers. They're usually staffed by nurse practitioners or physician assistants that have been trained to treat common illnesses and complaints. They're usually fairly quick and inexpensive. However, if you have serious medical issues or chronic medical problems, these are probably not your best option.  No Primary Care Doctor: - Call Health Connect at  [phone] - they can help you locate a primary care doctor that  accepts your insurance, provides certain services, etc. - Physician Referral Service- [phone]  Chronic Pain Problems: Organization         Address  Phone   Notes  [facility]  [phone] Patients need to be referred by their primary care doctor.   Medication Assistance: Organization         Address  Phone   Notes  [facility] Medication [facility] [address] [place] [place], [place] [address] [phone] --Must be a resident of [facility] -- Must have NO insurance coverage whatsoever (no Medicaid/ Medicare, etc.) -- The pt. MUST have a primary care doctor that directs their care regularly and follows them in the community   [facility]  [phone]   [place]  [address]  [phone]    Agencies that provide inexpensive medical care: Organization         Address  Phone   Notes  [facility]  [phone]   [facility]    [phone]   [facility] [address] [place], [place] [address] [phone]   [facility] [address] [place] [place] [phone]   Planned Parenthood    [phone]   [facility]    [phone]   Community Health  and [facility]  [address], [place] Phone:  [phone], Fax:  [phone] Hours of Operation:  9 am - 6 pm, M-F.  Also accepts Medicaid/Medicare and self-pay.  [facility]  [address], [place] Phone: [phone], Fax: [phone]. Hours of Operation:  8:30 am - 5:30 pm, M-F.  Also accepts Medicaid and self-pay.  [facility] [place] [address] [place] Phone: [phone]   [facility] [address] Natasha Bence [place], [place] [phone], Ext. [phone] Mondays & Thursdays: 7-9 AM.  First 15 patients are seen on a first come, first serve basis.    Medicaid-accepting [facility] Providers:  Organization         Address  Phone   Notes  [facility] [address], [place] [phone] Also accepts self-pay patients.  [facility] [address] Laurell Josephs [place], [place]  [phone]   [facility] [address], [place] [phone]   [facility] [address] [place] [phone]   Renaye Rakers [address], [place]   [phone] Only accepts [place] Access [place] patients after they have their name applied to their card.   Self-Pay (no insurance) in [facility]:  Organization         Address  Phone   Notes  Sickle Cell Patients, [facility] [address] [place], [place] [phone]   [facility] [address] [place], [place] [phone]   [facility]  [address], [place] [phone]   [facility]/Dr. Osei-Bonsu  [address], [place] or [address], [place] [phone] Phone number for both [place] and [place] locations is the same.  Urgent Medical and [facility] [address], [place] [phone]   [facility]  [address], [place] or [address] [phone]   [facility] [address] [place] [phone], phone; [phone], fax Sees patients 1st and 3rd Saturday of every month.  Must not qualify for public or private insurance (i.e. Medicaid, Medicare, [place] Health Choice, Veterans' Benefits)  Household income should be no more than 200% of the poverty level The clinic cannot treat you if you are pregnant or think you are pregnant  Sexually transmitted diseases are not treated at the clinic.    Dental Care: Organization         Address  Phone  Notes  [facility] Department of [facility] [address] [place], [place] [phone] Accepts children up to age 73 who are enrolled in [place] or [place] Health Choice; pregnant women with a Medicaid card; and  children who have applied for Medicaid or [place] Health Choice, but were declined, whose parents can pay a reduced fee at time of service.  [facility] Department of [facility]  [address], [place] [phone] Accepts children up to age 21 who are enrolled in [place] or [place] Health Choice; pregnant women with a Medicaid card; and children who have applied for Medicaid or [place] Health Choice, but were declined, whose parents can pay a reduced fee at time of service.  [facility]  [address] [place], [place] [phone] Patients are seen by appointment only. Walk-ins are not accepted. [facility] will see patients 73 years of age and older. Monday - Tuesday (8am-5pm) Most Wednesdays (8:30-5pm) $30 per visit, cash only  [facility] Adult Dental Access [facility]  [address], [facility] [phone] Patients are seen by appointment only. Walk-ins are not accepted. [facility] will see patients 38 years of age and older. One Wednesday Evening (Monthly: Volunteer Based).  $30 per visit, cash only   [employer]  [phone] for adults; Children under age 31, call Graduate Pediatric Dentistry at [phone]. Children aged 67-14, please call [phone] to request a pediatric application.  Dental services are provided in all areas of dental care including fillings, crowns and bridges, complete and partial dentures, implants, gum treatment, root canals, and extractions. Preventive care is also provided. Treatment is provided to both adults and children. Patients are selected via a lottery and there is often a waiting list.   [facility] [address] [place]  [phone] [email or web address]   [facility] [address] [place], [place] [phone], Ext. [phone] Second and Fourth Thursday of each month, opens at 6:30 AM; Clinic ends at 9 AM.  Patients are seen on a first-come first-served basis, and a limited number are seen during each clinic.   [facility]  [address] Ether Griffins [place], [place] [phone]   Eligibility Requirements You must have lived in [place], [place], or [place] counties for at least the last three months.   You cannot be eligible for state or federal sponsored [place], including [employer], [place], or [employer].   You generally cannot be eligible for healthcare insurance through your employer.    How to apply: Eligibility screenings are held every Tuesday and Wednesday afternoon from 1:00 pm until 4:00 pm. You do not need an appointment for the interview!  [facility] [address] [place], [place] [phone]   [facility] Department  [phone]   [facility]  [phone]   [facility]  [phone]    Behavioral Health Resources in the Community: Intensive Outpatient Programs Organization         Address  Phone  Notes  [facility] [address], [place],  [place] [phone]   [facility] Outpatient [address], [place], [place] [phone]   [facility] [address] [place], [place]  [phone]   [facility] Mental Health [address]  [place], [place] [phone] or [phone]   Substance Abuse Resources Organization         Address  Phone  Notes  [facility]  [phone]   [facility]  [phone]   [facility] [place]  [phone]   [facility]  [phone]   Residential &  Outpatient Substance Abuse Program  [phone]   Psychological Services Organization         Address  Phone  Notes  [facility] Health  [phone]   [facility]  [phone]   [facility] Mental Health [phone] [address] [place] [phone] or [phone]    Mobile Crisis Teams Organization         Address  Phone  Notes  [facility], Mobile Crisis Care Unit  [phone]   [facility]  [address] [place], [place] [phone]   Doristine Locks [address] [place] [phone]    Self-Help/Support Groups Organization         Address  Phone             Notes  [facility] [place] - variety of support groups  336- I7437963 Call for more information  Narcotics Anonymous (NA), [facility] [address], [employer] [place]  2 meetings at this location   Statistician         Address  Phone  Notes  [facility] [address],    [place]  [phone]   [facility]  [address] [place] [address] [place] [address], [place], [place] [phone]   [facility] [address] [place] [address] [place], [place] [phone] Admissions: 8am-3pm M-F  [facility] [address],    [place], [place] [phone]   [facility] [address] [place], [place], [place] [phone]   [facility] [address],  [place], [place] [phone]   [facility] - Intensive Outpatient [address] Laurell Josephs [address], [place], [place] [phone]   [facility] ([facility] [address] [place]  [place], [place] [phone] or [phone]   [facility] ([facility]) [address] [place] [address], [place], [place] [phone] Accepts Medicaid  [facility] [address],  [place] [phone] Substance Abuse/Addiction Treatment   [facility] Organization         Address  Phone  Notes  [facility]  [phone]   Angie Fava, PhD [address] Ervin Knack [place], [place]   [phone] or [phone]   [facility] Behavioral   [address] [place], [place] [phone]   [facility] [address] [place], [place] [phone] Insurance/Medicaid/sponsorship through [facility] and Families [address]                                    [place], [place] [phone] Therapy/tele-psych/case  [facility] [address], [place] [phone]    Dr. Lolly Mustache  [phone]   [facility]  [employer] [facility] Dept. 1) [address] [place] 2) [address], [place] 3)  [address], [place] [phone]  [phone]   [facility] Child Abuse Hotline [phone] or 6263351206 (After Hours)

## 2014-08-06 NOTE — ED Provider Notes (Signed)
CSN: [id number]     Arrival date & time 08/06/14  1530 History   First MD Initiated Contact with Patient 08/06/14 1952     Chief Complaint  Patient presents with  . Chest Pain     HPI Anne Ray is a 23 y.o. female with PMH of migraines presenting with sharp, pulling right sided chest pain at unspecified time last night while rolling on chest in bed. Pain is persistent and worse with breathing. Pain increased with lifting right arm. Patient has taken norco, NSAIDs without relief. Pain is not increased with eating, movement or activity. Patient denies recent illness or cough, hemoptysis, surgery, trauma, estrogen use, she does use depo injection, patient does not smoke. Patient without SOB. She is obese. No history of premature death in family member, dyslipidema, HTN, smoking or DM.    Past Medical History  Diagnosis Date  . Migraine   . Carpal tunnel syndrome   . Sickle cell trait    Past Surgical History  Procedure Laterality Date  . No past surgeries    . Vascular surgery     Family History  Problem Relation Age of Onset  . Diabetes Other    History  Substance Use Topics  . Smoking status: Never Smoker   . Smokeless tobacco: Not on file  . Alcohol Use: No   OB History   Grav Para Term Preterm Abortions TAB SAB Ect Mult Living   Review of Systems  Constitutional: Negative for fever and chills.  HENT: Negative for congestion and rhinorrhea.   Eyes: Negative for visual disturbance.  Respiratory: Negative for cough and shortness of breath.   Cardiovascular: Positive for chest pain. Negative for palpitations.  Gastrointestinal: Negative for nausea, vomiting and diarrhea.  Genitourinary: Negative for dysuria and hematuria.  Musculoskeletal: Negative for back pain and gait problem.  Skin: Negative for rash.  Neurological: Negative for weakness and headaches.      Allergies  Kiwi extract  Home Medications   Prior to Admission medications    Medication Sig Start Date End Date Taking? Authorizing Provider  HYDROcodone-acetaminophen (NORCO/VICODIN) 5-325 MG per tablet Take 2 tablets by mouth every 4 (four) hours as needed for moderate pain or severe pain. 08/06/14   Benetta Spar L Ilhan Debenedetto, PA-C   BP 125/75  Pulse 45  Temp(Src) 98 F (36.7 C) (Oral)  Resp 13  Wt 330 lb (149.687 kg)  SpO2 99% Physical Exam  Nursing note and vitals reviewed. Constitutional: She appears well-developed and well-nourished. No distress.  HENT:  Head: Normocephalic and atraumatic.  Eyes: Conjunctivae and EOM are normal. Right eye exhibits no discharge. Left eye exhibits no discharge. No scleral icterus.  Neck: No JVD present.  Cardiovascular: Normal rate, regular rhythm, normal heart sounds and intact distal pulses.   CP not reproducible. No peripheral edema or unilateral leg swelling, warmth or erythema or tenderness.  Pulmonary/Chest: Effort normal and breath sounds normal. No respiratory distress. She has no wheezes.  Abdominal: Soft. Bowel sounds are normal. She exhibits no distension. There is no tenderness.  Musculoskeletal: Normal range of motion. She exhibits no tenderness.  Neurological: She is alert. She exhibits normal muscle tone. Coordination normal.  Skin: Skin is warm and dry. She is not diaphoretic.  Psychiatric: She has a normal mood and affect. Her behavior is normal.    ED Course  Procedures (including critical care time) Labs Review Labs Reviewed  CBC  BASIC METABOLIC PANEL  Rosezena Sensor, ED  POC URINE PREG, ED    Imaging Review Dg Chest 2 View  08/06/2014   CLINICAL DATA:  Chest pain. Constant and worse with deep inspiration.  EXAM: CHEST  2 VIEW  COMPARISON:  None.  FINDINGS: The cardiopericardial silhouette appears are borderline to mildly enlarged and appears more prominent in size on today's chest radiographs than on prior chest radiographs of 2013 and 2011. Possibility of mild cardiomegaly or pericardial effusion  cannot be excluded. Pulmonary vascularity is within normal limits. Lung volumes are low, but the lungs appear clear. Negative for pleural effusion or pneumothorax. The trachea is midline. No acute osseous abnormality identified.  IMPRESSION: Heart size appears slightly larger on today's chest radiographs compared to priors. Cannot exclude pericardial effusion or mild cardiomegaly.  The lungs are clear.   Electronically Signed   By: Britta Mccreedy M.D.   On: 08/06/2014 17:15     EKG Interpretation   Date/Time:  Tuesday August 06 2014 15:36:28 EDT Ventricular Rate:  80 PR Interval:  164 QRS Duration: 72 QT Interval:  424 QTC Calculation: 489 R Axis:   15 Text Interpretation:  Normal sinus rhythm with sinus arrhythmia Prolonged  QT Abnormal ECG No prior for comparison Confirmed by Bernece Grant  MD, BLAIR  ([id number]) on 08/06/2014 7:52:52 PM     Meds given in ED:  Medications - No data to display  Discharge Medication List as of 08/06/2014  9:18 PM    START taking these medications   Details  HYDROcodone-acetaminophen (NORCO/VICODIN) 5-325 MG per tablet Take 2 tablets by mouth every 4 (four) hours as needed for moderate pain or severe pain., Starting 08/06/2014, Until Discontinued, Print          MDM   Final diagnoses:  Atypical chest pain   Patient is to be discharged with recommendation to follow up with PCP in regards to today's hospital visit. Chest pain is not likely of cardiac or pulmonary etiology d/t presentation, perc negative, VSS, no JVD or new murmur, RRR, breath sounds equal bilaterally, EKG with mild QT prolongation but no other abnormalities, negative troponin, and CXR cannot exclude pericardial effusion. Bed side [place] of heart performed by Dr. Sevin Grant without signs of effusion. Patient with low risk Heart score. Pain most likely of musculoskeletal origin d/t worsening with arm movement and positional changes. Pt has been advised to return to the ED is CP becomes exertional, associated  with diaphoresis or nausea, radiates to left jaw/arm, worsens or becomes concerning in any way. Tx with RICE, NSAIDs and short course of narcotics. Sedation and driving precautions provided. Pt appears reliable for follow up and is agreeable to discharge.   Discussed return precautions with patient. Discussed all results and patient verbalizes understanding and agrees with plan.  Case has been discussed with and seen by Dr. Monalisa Grant who agrees with the above plan to discharge.      Louann Sjogren, PA-C 08/07/14 1151

## 2014-08-06 NOTE — ED Notes (Signed)
Presents with sharp right sided/sternal chest pain began last night while rolling onto chest in bed. Pain is constant and worse with deep inspiration. Nothing makes pain better. Lifting right arm makes pain worse and feels like it is pulling. Bilateral breath sounds clear.  Denies cough.

## 2014-08-06 NOTE — ED Notes (Signed)
Pt stated "I'm a hard stick. The last time I was here they had to use the ultrasound machine to get blood".; attempted blood draw and could not get blood; phlebotomy called and Radonna Ricker will come and attempt to draw

## 2014-08-07 NOTE — ED Provider Notes (Signed)
Medical screening examination/treatment/procedure(s) were conducted as a shared visit with non-physician practitioner(s) and myself.  I personally evaluated the patient during the encounter.   EKG Interpretation   Date/Time:  Tuesday August 06 2014 15:36:28 EDT Ventricular Rate:  80 PR Interval:  164 QRS Duration: 72 QT Interval:  424 QTC Calculation: 489 R Axis:   15 Text Interpretation:  Normal sinus rhythm with sinus arrhythmia Prolonged  QT Abnormal ECG No prior for comparison Confirmed by Tyiana Grant  MD, Azusena Erlandson  ([id number]) on 08/06/2014 7:52:52 PM       EMERGENCY DEPARTMENT [place] CARDIAC EXAM "Study: Limited Ultrasound of the heart and pericardium"  INDICATIONS: CP, concern for pericardial effusion Multiple views of the heart and pericardium are obtained with a multi-frequency probe.  PERFORMED ZO:XWRUEA  IMAGES ARCHIVED?: Yes  FINDINGS: No pericardial effusion  LIMITATIONS:  Body habitus  VIEWS USED: Parasternal long axis and Parasternal short axis  INTERPRETATION: Cardiac activity absent and Pericardial effusioin absent    Patient here with atypical chest pain. Sharp, worse with elevation of the right arm. No difficulty breathing. Labs EKG okay here. Chest x-ray shows enlarged heart, concerning for possible pericardial effusion. Bedside ultrasound shows no effusion on my exam. Stable for discharge  Elwin Mocha, MD 08/07/14 [phone]

## 2014-09-30 ENCOUNTER — Encounter (HOSPITAL_COMMUNITY): Payer: Self-pay | Admitting: Emergency Medicine

## 2014-10-26 ENCOUNTER — Emergency Department (INDEPENDENT_AMBULATORY_CARE_PROVIDER_SITE_OTHER)
Admission: EM | Admit: 2014-10-26 | Discharge: 2014-10-26 | Disposition: A | Discharge status: Home / Self Care | Payer: BC Managed Care – PPO | Source: Home / Self Care | Attending: Family Medicine | Admitting: Family Medicine

## 2014-10-26 ENCOUNTER — Encounter (HOSPITAL_COMMUNITY): Payer: Self-pay | Admitting: *Deleted

## 2014-10-26 ENCOUNTER — Emergency Department (INDEPENDENT_AMBULATORY_CARE_PROVIDER_SITE_OTHER): Payer: BC Managed Care – PPO

## 2014-10-26 DIAGNOSIS — S93402A Sprain of unspecified ligament of left ankle, initial encounter: Secondary | ICD-10-CM

## 2014-10-26 NOTE — ED Notes (Signed)
Pt   Reports  Injured  Ankle  Pain     sev  Weeks  Ago  denys  Any  Recent  Injury

## 2014-10-26 NOTE — Discharge Instructions (Signed)
Wear ankle support as needed for comfort, activity as tolerated. advil and warm soak and supportive shoe as needed, return or see orthopedist if further problems.

## 2014-10-26 NOTE — ED Provider Notes (Signed)
CSN: [id number]     Arrival date & time 10/26/14  1824 History   First MD Initiated Contact with Patient 10/26/14 1832     Chief Complaint  Patient presents with  . Ankle Pain   (Consider location/radiation/quality/duration/timing/severity/associated sxs/prior Treatment) Patient is a [age] y.o. female presenting with ankle pain. The history is provided by the patient.  Ankle Pain Location:  Ankle Injury: yes   Mechanism of injury comment:  Jumping on trampoline 2 wks ago , uncertain injury, still sore. Ankle location:  L ankle Pain details:    Quality:  Sharp   Radiates to:  Does not radiate   Severity:  Moderate   Onset quality:  Gradual   Progression:  Unchanged Chronicity:  New Dislocation: no   Prior injury to area:  No   Past Medical History  Diagnosis Date  . Migraine   . Carpal tunnel syndrome   . Sickle cell trait    Past Surgical History  Procedure Laterality Date  . No past surgeries    . Vascular surgery     Family History  Problem Relation Age of Onset  . Diabetes Other    History  Substance Use Topics  . Smoking status: Never Smoker   . Smokeless tobacco: Not on file  . Alcohol Use: No   OB History    Gravida Para Term Preterm AB TAB SAB Ectopic Multiple Living   1 1 1       1      Review of Systems  Constitutional: Negative.   Musculoskeletal: Positive for joint swelling and gait problem.  Skin: Negative.     Allergies  Kiwi extract  Home Medications   Prior to Admission medications   Medication Sig Start Date End Date Taking? Authorizing Provider  HYDROcodone-acetaminophen (NORCO/VICODIN) 5-325 MG per tablet Take 2 tablets by mouth every 4 (four) hours as needed for moderate pain or severe pain. 08/06/14   Benetta SparVictoria L Creech, PA-C   BP 120/62 mmHg  Pulse 88  Temp(Src) 97.8 F (36.6 C) (Oral)  Resp 16  SpO2 99% Physical Exam  Constitutional: She is oriented to person, place, and time. She appears well-developed and well-nourished.    Musculoskeletal: She exhibits tenderness.       Left ankle: She exhibits decreased range of motion. She exhibits no swelling, no ecchymosis, no deformity and normal pulse. Tenderness. Medial malleolus tenderness found. No head of 5th metatarsal and no proximal fibula tenderness found.       Feet:  Neurological: She is alert and oriented to person, place, and time.  Skin: Skin is warm and dry.  Nursing note and vitals reviewed.   ED Course  Procedures (including critical care time) Labs Review Labs Reviewed - No data to display  Imaging Review Dg Ankle Complete Left  10/26/2014   CLINICAL DATA:  Left ankle injury 2 weeks ago jumping on trampoline  EXAM: LEFT ANKLE COMPLETE - 3+ VIEW  COMPARISON:  None.  FINDINGS: Three views of left ankle submitted. No acute fracture or subluxation. Small plantar spur of calcaneus.  IMPRESSION: No acute fracture or subluxation.  Small plantar spur of calcaneus.   Electronically Signed   By: Natasha MeadLiviu  Pop M.D.   On: 10/26/2014 19:28     MDM   1. Ankle sprain, left, initial encounter        Linna HoffJames D Kindl, MD 10/27/14 1455

## 2014-11-28 ENCOUNTER — Encounter: Payer: Self-pay | Admitting: Physical Medicine & Rehabilitation

## 2014-12-20 ENCOUNTER — Ambulatory Visit: Payer: BLUE CROSS/BLUE SHIELD | Admitting: Physical Medicine & Rehabilitation

## 2014-12-27 ENCOUNTER — Ambulatory Visit: Payer: BLUE CROSS/BLUE SHIELD | Admitting: Physical Medicine & Rehabilitation

## 2014-12-27 ENCOUNTER — Encounter: Payer: BLUE CROSS/BLUE SHIELD | Attending: Physical Medicine & Rehabilitation

## 2015-04-10 ENCOUNTER — Inpatient Hospital Stay (HOSPITAL_COMMUNITY)
Admission: AD | Admit: 2015-04-10 | Discharge: 2015-04-10 | Disposition: A | Discharge status: Home / Self Care | Payer: BLUE CROSS/BLUE SHIELD | Source: Ambulatory Visit | Attending: Obstetrics & Gynecology | Admitting: Obstetrics & Gynecology

## 2015-04-10 ENCOUNTER — Encounter (HOSPITAL_COMMUNITY): Payer: Self-pay | Admitting: *Deleted

## 2015-04-10 DIAGNOSIS — B9689 Other specified bacterial agents as the cause of diseases classified elsewhere: Secondary | ICD-10-CM

## 2015-04-10 DIAGNOSIS — A499 Bacterial infection, unspecified: Secondary | ICD-10-CM

## 2015-04-10 DIAGNOSIS — N760 Acute vaginitis: Secondary | ICD-10-CM | POA: Diagnosis present

## 2015-04-10 LAB — WET PREP, GENITAL
Trich, Wet Prep: NONE SEEN
Yeast Wet Prep HPF POC: NONE SEEN

## 2015-04-10 LAB — POCT PREGNANCY, URINE: PREG TEST UR: NEGATIVE

## 2015-04-10 MED ORDER — METRONIDAZOLE 500 MG PO TABS: 500.0000 mg | ORAL_TABLET | Freq: Two times a day (BID) | ORAL | Status: DC

## 2015-04-10 NOTE — MAU Note (Addendum)
PT  SAYS  HER  BOYFRIEND  WENT  HOSPITAL THIS AM   - TOLD   HE HAS YEAST INFECTION  .   PT DOES NOT  HAVE VAG D/C-  , NO ITCHING  NO PAIN   NO BIRTH CONTROL.   LAST DEPO SHOT  01-2014.   LAST SEX-  04-08-2015.    NO DR.

## 2015-04-10 NOTE — MAU Note (Signed)
Pt presents to MAU stating that her boyfriend went to the doctor and is being treated for a yeast infection and she wants to be checked to make sure she does not have one. Denies any vaginal bleeding or discharge. Denies any pain

## 2015-04-10 NOTE — Discharge Instructions (Signed)
Bacterial Vaginosis Bacterial vaginosis is a vaginal infection that occurs when the normal balance of bacteria in the vagina is disrupted. It results from an overgrowth of certain bacteria. This is the most common vaginal infection in women of childbearing age. Treatment is important to prevent complications, especially in pregnant women, as it can cause a premature delivery. CAUSES  Bacterial vaginosis is caused by an increase in harmful bacteria that are normally present in smaller amounts in the vagina. Several different kinds of bacteria can cause bacterial vaginosis. However, the reason that the condition develops is not fully understood. RISK FACTORS Certain activities or behaviors can put you at an increased risk of developing bacterial vaginosis, including:  Having a new sex partner or multiple sex partners.  Douching.  Using an intrauterine device (IUD) for contraception. Women do not get bacterial vaginosis from toilet seats, bedding, swimming pools, or contact with objects around them. SIGNS AND SYMPTOMS  Some women with bacterial vaginosis have no signs or symptoms. Common symptoms include:  Grey vaginal discharge.  A fishlike odor with discharge, especially after sexual intercourse.  Itching or burning of the vagina and vulva.  Burning or pain with urination. DIAGNOSIS  Your health care provider will take a medical history and examine the vagina for signs of bacterial vaginosis. A sample of vaginal fluid may be taken. Your health care provider will look at this sample under a microscope to check for bacteria and abnormal cells. A vaginal pH test may also be done.  TREATMENT  Bacterial vaginosis may be treated with antibiotic medicines. These may be given in the form of a pill or a vaginal cream. A second round of antibiotics may be prescribed if the condition comes back after treatment.  HOME CARE INSTRUCTIONS   Only take over-the-counter or prescription medicines as  directed by your health care provider.  If antibiotic medicine was prescribed, take it as directed. Make sure you finish it even if you start to feel better.  Do not have sex until treatment is completed.  Tell all sexual partners that you have a vaginal infection. They should see their health care provider and be treated if they have problems, such as a mild rash or itching.  Practice safe sex by using condoms and only having one sex partner. SEEK MEDICAL CARE IF:   Your symptoms are not improving after 3 days of treatment.  You have increased discharge or pain.  You have a fever. MAKE SURE YOU:   Understand these instructions.  Will watch your condition.  Will get help right away if you are not doing well or get worse. FOR MORE INFORMATION  Centers for Disease Control and Prevention, Division of STD Prevention: www.cdc.gov/std American Sexual Health Association (ASHA): www.ashastd.org  Document Released: 11/15/2005 Document Revised: 09/05/2013 Document Reviewed: 06/27/2013 ExitCare Patient Information 2015 [employer]. This information is not intended to replace advice given to you by your health care provider. Make sure you discuss any questions you have with your health care provider.  

## 2015-04-10 NOTE — MAU Provider Note (Signed)
History     CSN: [id number]  Arrival date and time: 04/10/15 1104   First Provider Initiated Contact with Patient 04/10/15 1140      Chief Complaint  Patient presents with  . Vaginitis   HPI  Anne Ray is a 24 y.o. G1P1001 who presents to MAU today with complaint of possible yeast infection. The patient states that her significant other was told he had a yeast infection at another ED and that it may be transmitted to her. She denies abdominal pain, vaginal discharge, bleeding, fever, UTI symptoms or N/V/D or constipation.    OB History    Gravida Para Term Preterm AB TAB SAB Ectopic Multiple Living   1 1 1       1       Past Medical History  Diagnosis Date  . Migraine   . Carpal tunnel syndrome   . Sickle cell trait     Past Surgical History  Procedure Laterality Date  . No past surgeries    . Vascular surgery    . Boil      2010- BOIL  ON RIGHT  HIP    Family History  Problem Relation Age of Onset  . Diabetes Other     History  Substance Use Topics  . Smoking status: Never Smoker   . Smokeless tobacco: Not on file  . Alcohol Use: No    Allergies:  Allergies  Allergen Reactions  . Kiwi Extract Swelling and Other (See Comments)    "tongue tingling"    No prescriptions prior to admission    Review of Systems  Constitutional: Negative for fever and malaise/fatigue.  Gastrointestinal: Negative for nausea, vomiting, abdominal pain, diarrhea and constipation.  Genitourinary: Negative for dysuria, urgency and frequency.       Neg - vaginal bleeding, discharge   Physical Exam   Blood pressure 118/66, pulse 70, temperature 98.3 F (36.8 C), temperature source Oral, resp. rate 20, height 5\' 8"  (1.727 m), weight 324 lb (146.965 kg), last menstrual period 03/21/2015.  Physical Exam  Nursing note and vitals reviewed. Constitutional: She is oriented to person, place, and time. She appears well-developed and well-nourished. No distress.  HENT:   Head: Normocephalic and atraumatic.  Cardiovascular: Normal rate.   Respiratory: Effort normal.  GI: Soft. She exhibits no distension and no mass. There is no tenderness. There is no rebound and no guarding.  Genitourinary: Uterus is not enlarged and not tender. Cervix exhibits no motion tenderness, no discharge and no friability. Right adnexum displays no mass and no tenderness. Left adnexum displays no mass and no tenderness. No bleeding in the vagina. Vaginal discharge (small amount of thick white discharge noted) found.  Neurological: She is alert and oriented to person, place, and time.  Skin: Skin is warm and dry. No erythema.  Psychiatric: She has a normal mood and affect.   Results for orders placed or performed during the hospital encounter of 04/10/15 (from the past 24 hour(s))  Pregnancy, urine POC     Status: None   Collection Time: 04/10/15 11:22 AM  Result Value Ref Range   Preg Test, Ur NEGATIVE NEGATIVE  Wet prep, genital     Status: Abnormal   Collection Time: 04/10/15 11:50 AM  Result Value Ref Range   Yeast Wet Prep HPF POC NONE SEEN NONE SEEN   Trich, Wet Prep NONE SEEN NONE SEEN   Clue Cells Wet Prep HPF POC FEW (A) NONE SEEN   WBC, Wet Prep HPF  POC FEW (A) NONE SEEN     MAU Course  Procedures None  MDM UPT - negative Wet prep, GC/Chlamydia, HIV and RPR today  Assessment and Plan  A: Bacterial vaginosis  P: Discharge home Rx for Flagyl given to patient GC/Chlamydia pending Warnings signs for worsening condition discussed Patient advised to follow-up with PCP as needed Patient may return to MAU as needed or if her condition were to change or worsen   Marny LowensteinJulie N Wenzel, PA-C  04/10/2015, 1:54 PM

## 2015-04-11 LAB — GC/CHLAMYDIA PROBE AMP (~~LOC~~) NOT AT ARMC
Chlamydia: NEGATIVE
Neisseria Gonorrhea: NEGATIVE

## 2015-04-11 LAB — RPR: RPR Ser Ql: NONREACTIVE

## 2015-04-11 LAB — HIV ANTIBODY (ROUTINE TESTING W REFLEX): HIV SCREEN 4TH GENERATION: NONREACTIVE

## 2015-05-12 ENCOUNTER — Encounter (HOSPITAL_COMMUNITY): Payer: Self-pay | Admitting: Emergency Medicine

## 2015-05-12 ENCOUNTER — Emergency Department (HOSPITAL_COMMUNITY)
Admission: EM | Admit: 2015-05-12 | Discharge: 2015-05-12 | Disposition: A | Discharge status: Home / Self Care | Payer: BLUE CROSS/BLUE SHIELD | Attending: Emergency Medicine | Admitting: Emergency Medicine

## 2015-05-12 DIAGNOSIS — G43909 Migraine, unspecified, not intractable, without status migrainosus: Secondary | ICD-10-CM | POA: Insufficient documentation

## 2015-05-12 DIAGNOSIS — B079 Viral wart, unspecified: Secondary | ICD-10-CM | POA: Insufficient documentation

## 2015-05-12 DIAGNOSIS — Z792 Long term (current) use of antibiotics: Secondary | ICD-10-CM | POA: Insufficient documentation

## 2015-05-12 DIAGNOSIS — Z79899 Other long term (current) drug therapy: Secondary | ICD-10-CM | POA: Diagnosis not present

## 2015-05-12 DIAGNOSIS — Z862 Personal history of diseases of the blood and blood-forming organs and certain disorders involving the immune mechanism: Secondary | ICD-10-CM | POA: Insufficient documentation

## 2015-05-12 DIAGNOSIS — Z8669 Personal history of other diseases of the nervous system and sense organs: Secondary | ICD-10-CM | POA: Insufficient documentation

## 2015-05-12 DIAGNOSIS — R2231 Localized swelling, mass and lump, right upper limb: Secondary | ICD-10-CM | POA: Diagnosis present

## 2015-05-12 MED ORDER — SULFAMETHOXAZOLE-TRIMETHOPRIM 800-160 MG PO TABS: 1.0000 | ORAL_TABLET | Freq: Two times a day (BID) | ORAL | Status: AC

## 2015-05-12 MED ORDER — IBUPROFEN 800 MG PO TABS: 800.0000 mg | ORAL_TABLET | Freq: Three times a day (TID) | ORAL | Status: DC

## 2015-05-12 NOTE — ED Provider Notes (Signed)
CSN: [id number]     Arrival date & time 05/12/15  0935 History   First MD Initiated Contact with Patient 05/12/15 [phone]     Chief Complaint  Patient presents with  . Abscess     (Consider location/radiation/quality/duration/timing/severity/associated sxs/prior Treatment) HPI Comments: Patient presents to the ER for evaluation of a painful bump on her right thumb. Patient reports that the symptoms have been ongoing for 3 months. She reports the area has gotten progressively larger. It is now more painful and tender and has been draining.  Patient is a 24 y.o. female presenting with abscess.  Abscess Associated symptoms: no fever     Past Medical History  Diagnosis Date  . Migraine   . Carpal tunnel syndrome   . Sickle cell trait    Past Surgical History  Procedure Laterality Date  . No past surgeries    . Vascular surgery    . Boil      2010- BOIL  ON RIGHT  HIP   Family History  Problem Relation Age of Onset  . Diabetes Other    History  Substance Use Topics  . Smoking status: Never Smoker   . Smokeless tobacco: Not on file  . Alcohol Use: No   OB History    Gravida Para Term Preterm AB TAB SAB Ectopic Multiple Living   Review of Systems  Constitutional: Negative for fever.  Skin: Positive for rash.      Allergies  Kiwi extract  Home Medications   Prior to Admission medications   Medication Sig Start Date End Date Taking? Authorizing Provider  acetaminophen-codeine (TYLENOL #3) 300-30 MG per tablet Take 1 tablet by mouth every 6 (six) hours as needed for moderate pain.    Historical Provider, MD  aspirin-acetaminophen-caffeine (EXCEDRIN MIGRAINE) [phone] MG per tablet Take by mouth every 6 (six) hours as needed for headache or migraine.    Historical Provider, MD  BIOTIN PO Take 1 tablet by mouth daily.    Historical Provider, MD  metroNIDAZOLE (FLAGYL) 500 MG tablet Take 1 tablet (500 mg total) by mouth 2 (two) times daily. 04/10/15    Marny Lowenstein, PA-C   BP 123/67 mmHg  Pulse 89  Temp(Src) 98.8 F (37.1 C) (Oral)  Resp 16  SpO2 100% Physical Exam  Constitutional: She is oriented to person, place, and time. She appears well-developed and well-nourished. No distress.  HENT:  Head: Normocephalic and atraumatic.  Right Ear: Hearing normal.  Left Ear: Hearing normal.  Nose: Nose normal.  Mouth/Throat: Oropharynx is clear and moist and mucous membranes are normal.  Eyes: Conjunctivae and EOM are normal. Pupils are equal, round, and reactive to light.  Neck: Normal range of motion. Neck supple.  Cardiovascular: Regular rhythm, S1 normal and S2 normal.  Exam reveals no gallop and no friction rub.   No murmur heard. Pulmonary/Chest: Effort normal and breath sounds normal. No respiratory distress. She exhibits no tenderness.  Abdominal: Soft. Normal appearance and bowel sounds are normal. There is no hepatosplenomegaly. There is no tenderness. There is no rebound, no guarding, no tenderness at McBurney's point and negative Murphy's sign. No hernia.  Musculoskeletal: Normal range of motion.  Neurological: She is alert and oriented to person, place, and time. She has normal strength. No cranial nerve deficit or sensory deficit. Coordination normal. GCS eye subscore is 4. GCS verbal subscore is 5. GCS motor subscore is 6.  Skin: Skin  is warm, dry and intact. No rash noted. No cyanosis.  4 cm raised wart over dorsal aspect of right thumb IP joint. Slight erythema present. Area is tender to the touch. No induration, fluctuance or drainage.  Psychiatric: She has a normal mood and affect. Her speech is normal and behavior is normal. Thought content normal.  Nursing note and vitals reviewed.   ED Course  Procedures (including critical care time) Labs Review Labs Reviewed - No data to display  Imaging Review No results found.   EKG Interpretation None      MDM   Final diagnoses:  None  wart   patient presents with  a painful wart on right thumb. She reports drainage, there is mild erythema associated. Cannot rule out superinfection. Will treat for infection. Patient counseled that she needs to follow-up with skin specialist for wart removal. She was counseled that she could try over-the-counter wart products from the pharmacy.      Gilda Crease, MD 05/12/15 [phone]

## 2015-05-12 NOTE — ED Notes (Signed)
Pt has abscess to right thumb, has been there 3 months. Painful to touch.

## 2015-05-12 NOTE — ED Notes (Signed)
Awake. Verbally responsive. A/O x4. Resp even and unlabored. No audible adventitious breath sounds noted. ABC's intact.  

## 2015-05-12 NOTE — Discharge Instructions (Signed)
Warts ° Warts are common. They are caused by a virus. Warts are most common in older children. There may be a single wart or there may be many warts. The size and location varies. They can be spread by scratching the wart and then scratching normal skin. Most warts will disappear over many months to a couple years. °HOME CARE °· Follow home care directions as told by your doctor. °· Keep all doctor visits as told. Warts may return. °GET HELP RIGHT AWAY IF: °The treated skin becomes red, puffy (swollen), or painful. °MAKE SURE YOU: °· Understand these instructions. °· Will watch your condition. °· Will get help right away if you are not doing well or get worse. °Document Released: 03/18/2011 Document Revised: 02/07/2012 Document Reviewed: 03/18/2011 °ExitCare® Patient Information ©2015 [employer]. This information is not intended to replace advice given to you by your health care provider. Make sure you discuss any questions you have with your health care provider. ° °

## 2015-05-12 NOTE — ED Notes (Signed)
Pt reported abscess to rt thumb and raised without redness noted.

## 2015-06-05 ENCOUNTER — Inpatient Hospital Stay (HOSPITAL_COMMUNITY)
Admission: AD | Admit: 2015-06-05 | Discharge: 2015-06-05 | Disposition: A | Discharge status: Home / Self Care | Payer: BLUE CROSS/BLUE SHIELD | Source: Ambulatory Visit | Attending: Family Medicine | Admitting: Family Medicine

## 2015-06-05 ENCOUNTER — Encounter (HOSPITAL_COMMUNITY): Payer: Self-pay | Admitting: *Deleted

## 2015-06-05 DIAGNOSIS — N939 Abnormal uterine and vaginal bleeding, unspecified: Secondary | ICD-10-CM | POA: Diagnosis not present

## 2015-06-05 DIAGNOSIS — R102 Pelvic and perineal pain: Secondary | ICD-10-CM | POA: Insufficient documentation

## 2015-06-05 DIAGNOSIS — D72829 Elevated white blood cell count, unspecified: Secondary | ICD-10-CM

## 2015-06-05 LAB — URINALYSIS, ROUTINE W REFLEX MICROSCOPIC
Bilirubin Urine: NEGATIVE
GLUCOSE, UA: NEGATIVE mg/dL
Ketones, ur: NEGATIVE mg/dL
LEUKOCYTES UA: NEGATIVE
Nitrite: NEGATIVE
PH: 5 (ref 5.0–8.0)
Protein, ur: NEGATIVE mg/dL
Specific Gravity, Urine: >1.03 — ABNORMAL HIGH (ref 1.005–1.030)
Urobilinogen, UA: 0.2 mg/dL (ref 0.0–1.0)

## 2015-06-05 LAB — CBC WITH DIFFERENTIAL/PLATELET
BASOS ABS: 0 10*3/uL (ref 0.0–0.1)
BASOS PCT: 0 % (ref 0–1)
Eosinophils Absolute: 0.1 10*3/uL (ref 0.0–0.7)
Eosinophils Relative: 1 % (ref 0–5)
HEMATOCRIT: 38.7 % (ref 36.0–46.0)
Hemoglobin: 13.2 g/dL (ref 12.0–15.0)
LYMPHS PCT: 25 % (ref 12–46)
Lymphs Abs: 3.8 10*3/uL (ref 0.7–4.0)
MCH: 28.6 pg (ref 26.0–34.0)
MCHC: 34.1 g/dL (ref 30.0–36.0)
MCV: 83.9 fL (ref 78.0–100.0)
Monocytes Absolute: 0.7 10*3/uL (ref 0.1–1.0)
Monocytes Relative: 5 % (ref 3–12)
NEUTROS ABS: 10.3 10*3/uL — AB (ref 1.7–7.7)
Neutrophils Relative %: 69 % (ref 43–77)
PLATELETS: 230 10*3/uL (ref 150–400)
RBC: 4.61 MIL/uL (ref 3.87–5.11)
RDW: 14.6 % (ref 11.5–15.5)
WBC: 14.9 10*3/uL — AB (ref 4.0–10.5)

## 2015-06-05 LAB — WET PREP, GENITAL
Clue Cells Wet Prep HPF POC: NONE SEEN
Trich, Wet Prep: NONE SEEN
Yeast Wet Prep HPF POC: NONE SEEN

## 2015-06-05 LAB — URINE MICROSCOPIC-ADD ON

## 2015-06-05 LAB — POCT PREGNANCY, URINE: Preg Test, Ur: NEGATIVE

## 2015-06-05 MED ORDER — KETOROLAC TROMETHAMINE 60 MG/2ML IM SOLN
60.0000 mg | Freq: Once | INTRAMUSCULAR | Status: AC
  Administered 2015-06-05: 60 mg via INTRAMUSCULAR
  Filled 2015-06-05: qty 2

## 2015-06-05 NOTE — MAU Note (Signed)
Thinks she is 6 days early, started bleeding with a gush.   Started yesterday.  Stomach was cramping real, real bad- went to the bathroom and something dropped out - noted a large clots.  Since then cramping upper and lower stomach.

## 2015-06-05 NOTE — MAU Provider Note (Signed)
History     CSN: [id number]  Arrival date and time: 06/05/15 1524   First Provider Initiated Contact with Patient 06/05/15 1607      Chief Complaint  Patient presents with  . Abdominal Pain  . Vaginal Bleeding   HPI Anne Ray 24 y.o. G1P1001 nonpregnant female presents for vaginal bleeding.  She is 6 days early for menses.  It started yesterday very heavy.  Today, her stomach was hurting worse.  She dropped a 2-3 inch blood clot into the toilet.  Usual periods are very light without blood clots for her.  She has constant pain, 7-8/10 but gets to be 10/10 with a cramp.  She is continuing to have bleeding with small clots.   She saturates a pad within 2-3 hours.  She denies nausea, vomiting, chest pain, fever, SOB, dysuria.   OB History    Gravida Para Term Preterm AB TAB SAB Ectopic Multiple Living   Past Medical History  Diagnosis Date  . Migraine   . Carpal tunnel syndrome   . Sickle cell trait     Past Surgical History  Procedure Laterality Date  . No past surgeries    . Vascular surgery    . Boil      2010- BOIL  ON RIGHT  HIP    Family History  Problem Relation Age of Onset  . Diabetes Other     History  Substance Use Topics  . Smoking status: Never Smoker   . Smokeless tobacco: Not on file  . Alcohol Use: No    Allergies:  Allergies  Allergen Reactions  . Kiwi Extract Swelling and Other (See Comments)    "tongue tingling"    Prescriptions prior to admission  Medication Sig Dispense Refill Last Dose  . acetaminophen-codeine (TYLENOL #3) 300-30 MG per tablet Take 1 tablet by mouth every 6 (six) hours as needed for moderate pain.   Past Week at Unknown time  . aspirin-acetaminophen-caffeine (EXCEDRIN MIGRAINE) 250-250-65 MG per tablet Take by mouth every 6 (six) hours as needed for headache or migraine.   Past Month at Unknown time  . BIOTIN PO Take 1 tablet by mouth daily.   06/05/2015 at Unknown time  . ibuprofen  (ADVIL,MOTRIN) 800 MG tablet Take 1 tablet (800 mg total) by mouth 3 (three) times daily. 21 tablet 0 Past Month at Unknown time  . metroNIDAZOLE (FLAGYL) 500 MG tablet Take 1 tablet (500 mg total) by mouth 2 (two) times daily. (Patient not taking: Reported on 06/05/2015) 14 tablet 0 Completed Course at Unknown time    ROS Pertinent ROS in HPI.  All other systems are negative.   Physical Exam   Blood pressure 121/70, pulse 79, temperature 98.6 F (37 C), temperature source Oral, resp. rate 20, height  (1.702 m), weight 318 lb (144.244 kg), last menstrual period 06/04/2015.  Physical Exam  Constitutional: She is oriented to person, place, and time. She appears well-developed and well-nourished. No distress.  HENT:  Head: Normocephalic and atraumatic.  Eyes: EOM are normal.  Neck: Normal range of motion.  Cardiovascular: Normal rate, regular rhythm and normal heart sounds.   Respiratory: Effort normal and breath sounds normal. No respiratory distress.  GI: Soft. She exhibits no distension.  Genitourinary:  Mod amt of dark red blood in vault.   No CMT.  No adnexal tenderness or mass appreciated.   Musculoskeletal: Normal range of motion.  Neurological: She is alert and oriented to person, place, and time.  Skin: Skin is warm and dry.  Psychiatric: She has a normal mood and affect.   Results for orders placed or performed during the hospital encounter of 06/05/15 (from the past 24 hour(s))  Urinalysis, Routine w reflex microscopic (not at [facility])     Status: Abnormal   Collection Time: 06/05/15  3:45 PM  Result Value Ref Range   Color, Urine RED (A) YELLOW   APPearance HAZY (A) CLEAR   Specific Gravity, Urine >1.030 (H) 1.005 - 1.030   pH 5.0 5.0 - 8.0   Glucose, UA NEGATIVE NEGATIVE mg/dL   Hgb urine dipstick LARGE (A) NEGATIVE   Bilirubin Urine NEGATIVE NEGATIVE   Ketones, ur NEGATIVE NEGATIVE mg/dL   Protein, ur NEGATIVE NEGATIVE mg/dL   Urobilinogen, UA 0.2 0.0 - 1.0 mg/dL    Nitrite NEGATIVE NEGATIVE   Leukocytes, UA NEGATIVE NEGATIVE  Urine microscopic-add on     Status: Abnormal   Collection Time: 06/05/15  3:45 PM  Result Value Ref Range   Squamous Epithelial / LPF RARE RARE   WBC, UA 0-2 <3 WBC/hpf   RBC / HPF TOO NUMEROUS TO COUNT <3 RBC/hpf   Bacteria, UA FEW (A) RARE  Pregnancy, urine POC     Status: None   Collection Time: 06/05/15  3:47 PM  Result Value Ref Range   Preg Test, Ur NEGATIVE NEGATIVE  Wet prep, genital     Status: Abnormal   Collection Time: 06/05/15  4:20 PM  Result Value Ref Range   Yeast Wet Prep HPF POC NONE SEEN NONE SEEN   Trich, Wet Prep NONE SEEN NONE SEEN   Clue Cells Wet Prep HPF POC NONE SEEN NONE SEEN   WBC, Wet Prep HPF POC RARE (A) NONE SEEN  CBC with Differential/Platelet     Status: Abnormal   Collection Time: 06/05/15  4:39 PM  Result Value Ref Range   WBC 14.9 (H) 4.0 - 10.5 K/uL   RBC 4.61 3.87 - 5.11 MIL/uL   Hemoglobin 13.2 12.0 - 15.0 g/dL   HCT 52.838.7 41.336.0 - 24.446.0 %   MCV 83.9 78.0 - 100.0 fL   MCH 28.6 26.0 - 34.0 pg   MCHC 34.1 30.0 - 36.0 g/dL   RDW 01.014.6 27.211.5 - 53.615.5 %   Platelets 230 150 - 400 K/uL   Neutrophils Relative % 69 43 - 77 %   Neutro Abs 10.3 (H) 1.7 - 7.7 K/uL   Lymphocytes Relative 25 12 - 46 %   Lymphs Abs 3.8 0.7 - 4.0 K/uL   Monocytes Relative 5 3 - 12 %   Monocytes Absolute 0.7 0.1 - 1.0 K/uL   Eosinophils Relative 1 0 - 5 %   Eosinophils Absolute 0.1 0.0 - 0.7 K/uL   Basophils Relative 0 0 - 1 %   Basophils Absolute 0.0 0.0 - 0.1 K/uL    MAU Course  Procedures  MDM Negative pregnancy Toradol ordered for pain management Leukocytosis but no indication of source  Assessment and Plan  A: Pelvic pain with Abnormal Uterine Bleeding and Leukocytosis  P: Discharge to home OTC Ibuprofen GC/Chlam pending Good PO hydration F/u with GYN prn Patient may return to MAU as needed or if her condition were to change or worsen   Bertram Denvereague Clark, Karen E 06/05/2015, 4:08 PM

## 2015-06-05 NOTE — Discharge Instructions (Signed)
Abnormal Uterine Bleeding Abnormal uterine bleeding can affect women at various stages in life, including teenagers, women in their reproductive years, pregnant women, and women who have reached menopause. Several kinds of uterine bleeding are considered abnormal, including:  Bleeding or spotting between periods.   Bleeding after sexual intercourse.   Bleeding that is heavier or more than normal.   Periods that last longer than usual.  Bleeding after menopause.  Many cases of abnormal uterine bleeding are minor and simple to treat, while others are more serious. Any type of abnormal bleeding should be evaluated by your health care provider. Treatment will depend on the cause of the bleeding. HOME CARE INSTRUCTIONS Monitor your condition for any changes. The following actions may help to alleviate any discomfort you are experiencing:  Avoid the use of tampons and douches as directed by your health care provider.  Change your pads frequently. You should get regular pelvic exams and Pap tests. Keep all follow-up appointments for diagnostic tests as directed by your health care provider.  SEEK MEDICAL CARE IF:   Your bleeding lasts more than 1 week.   You feel dizzy at times.  SEEK IMMEDIATE MEDICAL CARE IF:   You pass out.   You are changing pads every 15 to 30 minutes.   You have abdominal pain.  You have a fever.   You become sweaty or weak.   You are passing large blood clots from the vagina.   You start to feel nauseous and vomit. MAKE SURE YOU:   Understand these instructions.  Will watch your condition.  Will get help right away if you are not doing well or get worse. Document Released: 11/15/2005 Document Revised: 11/20/2013 Document Reviewed: 06/14/2013 ExitCare Patient Information 2015 [employer]. This information is not intended to replace advice given to you by your health care provider. Make sure you discuss any questions you have with your  health care provider.  

## 2015-06-06 LAB — HIV ANTIBODY (ROUTINE TESTING W REFLEX): HIV SCREEN 4TH GENERATION: NONREACTIVE

## 2015-06-06 LAB — GC/CHLAMYDIA PROBE AMP (~~LOC~~) NOT AT ARMC
Chlamydia: NEGATIVE
NEISSERIA GONORRHEA: NEGATIVE

## 2015-10-12 ENCOUNTER — Emergency Department (HOSPITAL_COMMUNITY)
Admission: EM | Admit: 2015-10-12 | Discharge: 2015-10-12 | Disposition: A | Discharge status: Home / Self Care | Payer: BLUE CROSS/BLUE SHIELD | Attending: Physician Assistant | Admitting: Physician Assistant

## 2015-10-12 ENCOUNTER — Encounter (HOSPITAL_COMMUNITY): Payer: Self-pay | Admitting: Emergency Medicine

## 2015-10-12 DIAGNOSIS — Z79899 Other long term (current) drug therapy: Secondary | ICD-10-CM | POA: Diagnosis not present

## 2015-10-12 DIAGNOSIS — Z8669 Personal history of other diseases of the nervous system and sense organs: Secondary | ICD-10-CM | POA: Diagnosis not present

## 2015-10-12 DIAGNOSIS — B349 Viral infection, unspecified: Secondary | ICD-10-CM | POA: Diagnosis not present

## 2015-10-12 DIAGNOSIS — J069 Acute upper respiratory infection, unspecified: Secondary | ICD-10-CM | POA: Diagnosis not present

## 2015-10-12 DIAGNOSIS — Z791 Long term (current) use of non-steroidal anti-inflammatories (NSAID): Secondary | ICD-10-CM | POA: Insufficient documentation

## 2015-10-12 DIAGNOSIS — Z862 Personal history of diseases of the blood and blood-forming organs and certain disorders involving the immune mechanism: Secondary | ICD-10-CM | POA: Diagnosis not present

## 2015-10-12 DIAGNOSIS — R0981 Nasal congestion: Secondary | ICD-10-CM | POA: Diagnosis present

## 2015-10-12 DIAGNOSIS — R059 Cough, unspecified: Secondary | ICD-10-CM

## 2015-10-12 DIAGNOSIS — Z8679 Personal history of other diseases of the circulatory system: Secondary | ICD-10-CM | POA: Insufficient documentation

## 2015-10-12 DIAGNOSIS — J029 Acute pharyngitis, unspecified: Secondary | ICD-10-CM

## 2015-10-12 DIAGNOSIS — J3489 Other specified disorders of nose and nasal sinuses: Secondary | ICD-10-CM

## 2015-10-12 DIAGNOSIS — R05 Cough: Secondary | ICD-10-CM

## 2015-10-12 NOTE — Discharge Instructions (Signed)
Continue to stay well-hydrated. Gargle warm salt water and spit it out. Use chloraseptic spray or throat lozenges as needed for sore throat. Continue to alternate between Tylenol and Ibuprofen for pain or fever. Use Mucinex for cough suppression/expectoration of mucus. Use netipot and flonase to help with nasal congestion. May consider over-the-counter Benadryl or other antihistamine to decrease secretions and for watery itchy eyes. Followup with [facility] in 5-7 days for recheck of ongoing symptoms. Return to emergency department for emergent changing or worsening of symptoms.   Cough, Adult A cough helps to clear your throat and lungs. A cough may last only 2-3 weeks (acute), or it may last longer than 8 weeks (chronic). Many different things can cause a cough. A cough may be a sign of an illness or another medical condition. HOME CARE  Pay attention to any changes in your cough.  Take medicines only as told by your doctor.  If you were prescribed an antibiotic medicine, take it as told by your doctor. Do not stop taking it even if you start to feel better.  Talk with your doctor before you try using a cough medicine.  Drink enough fluid to keep your pee (urine) clear or pale yellow.  If the air is dry, use a cold steam vaporizer or humidifier in your home.  Stay away from things that make you cough at work or at home.  If your cough is worse at night, try using extra pillows to raise your head up higher while you sleep.  Do not smoke, and try not to be around smoke. If you need help quitting, ask your doctor.  Do not have caffeine.  Do not drink alcohol.  Rest as needed. GET HELP IF:  You have new problems (symptoms).  You cough up yellow fluid (pus).  Your cough does not get better after 2-3 weeks, or your cough gets worse.  Medicine does not help your cough and you are not sleeping well.  You have pain that gets worse or pain that is not helped with  medicine.  You have a fever.  You are losing weight and you do not know why.  You have night sweats. GET HELP RIGHT AWAY IF:  You cough up blood.  You have trouble breathing.  Your heartbeat is very fast.   This information is not intended to replace advice given to you by your health care provider. Make sure you discuss any questions you have with your health care provider.   Document Released: 07/29/2011 Document Revised: 08/06/2015 Document Reviewed: 01/22/2015 Elsevier Interactive Patient Education 2016 Else[employer] Inc[employer]  Viral Infections A virus is a type of germ. Viruses can cause:  Minor sore throats.  Aches and pains.  Headaches.  Runny nose.  Rashes.  Watery eyes.  Tiredness.  Coughs.  Loss of appetite.  Feeling sick to your stomach (nausea).  Throwing up (vomiting).  Watery poop (diarrhea). HOME CARE   Only take medicines as told by your doctor.  Drink enough water and fluids to keep your pee (urine) clear or pale yellow. Sports drinks are a good choice.  Get plenty of rest and eat healthy. Soups and broths with crackers or rice are fine. GET HELP RIGHT AWAY IF:   You have a very bad headache.  You have shortness of breath.  You have chest pain or neck pain.  You have an unusual rash.  You cannot stop throwing up.  You have watery poop that does not stop.  You cannot keep fluids down.  You or your child has a temperature by mouth above 102 F (38.9 C), not controlled by medicine.  Your baby is older than 3 months with a rectal temperature of 102 F (38.9 C) or higher.  Your baby is 7 months old or younger with a rectal temperature of 100.4 F (38 C) or higher. MAKE SURE YOU:   Understand these instructions.  Will watch this condition.  Will get help right away if you are not doing well or get worse.   This information is not intended to replace advice given to you by your health care provider. Make sure you discuss any  questions you have with your health care provider.   Document Released: 10/28/2008 Document Revised: 02/07/2012 Document Reviewed: 04/23/2015 Elsevier Interactive Patient Education 2016 Else[employer] Inc.  Sore Throat A sore throat is pain, burning, irritation, or scratchiness of the throat. There is often pain or tenderness when swallowing or talking. A sore throat may be accompanied by other symptoms, such as coughing, sneezing, fever, and swollen neck glands. A sore throat is often the first sign of another sickness, such as a cold, flu, strep throat, or mononucleosis (commonly known as mono). Most sore throats go away without medical treatment. CAUSES  The most common causes of a sore throat include:  A viral infection, such as a cold, flu, or mono.  A bacterial infection, such as strep throat, tonsillitis, or whooping cough.  Seasonal allergies.  Dryness in the air.  Irritants, such as smoke or pollution.  Gastroesophageal reflux disease (GERD). HOME CARE INSTRUCTIONS   Only take over-the-counter medicines as directed by your caregiver.  Drink enough fluids to keep your urine clear or pale yellow.  Rest as needed.  Try using throat sprays, lozenges, or sucking on hard candy to ease any pain (if older than 4 years or as directed).  Sip warm liquids, such as broth, herbal tea, or warm water with honey to relieve pain temporarily. You may also eat or drink cold or frozen liquids such as frozen ice pops.  Gargle with salt water (mix 1 tsp salt with 8 oz of water).  Do not smoke and avoid secondhand smoke.  Put a cool-mist humidifier in your bedroom at night to moisten the air. You can also turn on a hot shower and sit in the bathroom with the door closed for 5-10 minutes. SEEK IMMEDIATE MEDICAL CARE IF:  You have difficulty breathing.  You are unable to swallow fluids, soft foods, or your saliva.  You have increased swelling in the throat.  Your sore throat does not get  better in 7 days.  You have nausea and vomiting.  You have a fever or persistent symptoms for more than 2-3 days.  You have a fever and your symptoms suddenly get worse. MAKE SURE YOU:   Understand these instructions.  Will watch your condition.  Will get help right away if you are not doing well or get worse.   This information is not intended to replace advice given to you by your health care provider. Make sure you discuss any questions you have with your health care provider.   Document Released: 12/23/2004 Document Revised: 12/06/2014 Document Reviewed: 07/23/2012 Elsevier Interactive Patient Education [employer]

## 2015-10-12 NOTE — ED Provider Notes (Signed)
CSN: [id number]     Arrival date & time 10/12/15  0818 History   First MD Initiated Contact with Patient 10/12/15 [phone]     No chief complaint on file.    (Consider location/radiation/quality/duration/timing/severity/associated sxs/prior Treatment) HPI Comments: Anne Ray is a 24 y.o. female with a PMHx of migraines, carpal tunnel syndrome, and sickle cell trait, who presents to the ED with complaints of URI symptoms including green rhinorrhea, wet cough, sore throat, sinus congestion, and chills. Symptoms have been unrelieved with NyQuil, and have no aggravating factors. She has positive sick contacts at home. Patient denies any fevers, trismus, drooling, ear pain or drainage, eye pain or itching, eye drainage, chest pain, shortness breath, wheezing, abdominal pain, nausea, vomiting, diarrhea, constipation, dysuria, hematuria, numbness, tingling, weakness, or body aches.  Patient is a 24 y.o. female presenting with URI. The history is provided by the patient. No language interpreter was used.  URI Presenting symptoms: congestion, cough, rhinorrhea and sore throat   Presenting symptoms: no ear pain and no fever   Severity:  Moderate Onset quality:  Gradual Duration:  8 days Timing:  Constant Progression:  Unchanged Chronicity:  New Relieved by:  Nothing Worsened by:  Nothing tried Ineffective treatments:  Decongestant Associated symptoms: sinus pain   Associated symptoms: no arthralgias, no myalgias and no wheezing   Risk factors: sick contacts   Risk factors: no recent travel     Past Medical History  Diagnosis Date  . Migraine   . Carpal tunnel syndrome   . Sickle cell trait    Past Surgical History  Procedure Laterality Date  . No past surgeries    . Vascular surgery    . Boil      2010- BOIL  ON RIGHT  HIP   Family History  Problem Relation Age of Onset  . Diabetes Other    Social History  Substance Use Topics  . Smoking status: Never Smoker   .  Smokeless tobacco: Not on file  . Alcohol Use: No   OB History    Gravida Para Term Preterm AB TAB SAB Ectopic Multiple Living   1 1 1       1      Review of Systems  Constitutional: Positive for chills. Negative for fever.  HENT: Positive for congestion, rhinorrhea, sinus pressure and sore throat. Negative for drooling, ear discharge, ear pain and trouble swallowing.   Eyes: Negative for pain, discharge and itching.  Respiratory: Positive for cough. Negative for shortness of breath and wheezing.   Cardiovascular: Negative for chest pain.  Gastrointestinal: Negative for nausea, vomiting, abdominal pain, diarrhea and constipation.  Genitourinary: Negative for dysuria and hematuria.  Musculoskeletal: Negative for myalgias and arthralgias.  Skin: Negative for color change.  Allergic/Immunologic: Negative for immunocompromised state.  Neurological: Negative for weakness and numbness.  Psychiatric/Behavioral: Negative for confusion.   10 Systems reviewed and are negative for acute change except as noted in the HPI.    Allergies  Kiwi extract  Home Medications   Prior to Admission medications   Medication Sig Start Date End Date Taking? Authorizing Provider  BIOTIN PO Take 1 tablet by mouth daily.    Historical Provider, MD  ibuprofen (ADVIL,MOTRIN) 800 MG tablet Take 1 tablet (800 mg total) by mouth 3 (three) times daily. 05/12/15   Gilda Creasehristopher J Pollina, MD   BP 149/93 mmHg  Pulse 98  Temp(Src) 98.3 F (36.8 C) (Oral)  Resp 18  SpO2 98% Physical Exam  Constitutional: She is  oriented to person, place, and time. Vital signs are normal. She appears well-developed and well-nourished.  Non-toxic appearance. No distress.  Afebrile, nontoxic, NAD  HENT:  Head: Normocephalic and atraumatic.  Nose: Mucosal edema and rhinorrhea present.  Mouth/Throat: Uvula is midline and mucous membranes are normal. No trismus in the jaw. No uvula swelling. Posterior oropharyngeal erythema present. No  oropharyngeal exudate, posterior oropharyngeal edema or tonsillar abscesses.  Nose with mild mucosal edema and clear rhinorrhea. Oropharynx erythematous without uvular swelling or deviation, no trismus or drooling, no tonsillar swelling, no exudates.  No PTA  Eyes: Conjunctivae and EOM are normal. Right eye exhibits no discharge. Left eye exhibits no discharge.  Neck: Normal range of motion. Neck supple.  Cardiovascular: Normal rate, regular rhythm, normal heart sounds and intact distal pulses.  Exam reveals no gallop and no friction rub.   No murmur heard. Pulmonary/Chest: Effort normal and breath sounds normal. No respiratory distress. She has no decreased breath sounds. She has no wheezes. She has no rhonchi. She has no rales.  CTAB in all lung fields, no w/r/r, no hypoxia or increased WOB, speaking in full sentences, SpO2 98% on RA   Abdominal: Soft. Normal appearance and bowel sounds are normal. She exhibits no distension. There is no tenderness. There is no rigidity, no rebound, no guarding and no tenderness at McBurney's point.  Musculoskeletal: Normal range of motion.  Lymphadenopathy:       Head (right side): No submandibular and no tonsillar adenopathy present.       Head (left side): No submandibular and no tonsillar adenopathy present.    She has cervical adenopathy.  Shotty cervical LAD bilaterally which is mildly TTP  Neurological: She is alert and oriented to person, place, and time. She has normal strength. No sensory deficit.  Skin: Skin is warm, dry and intact. No rash noted.  Psychiatric: She has a normal mood and affect. Her behavior is normal.  Nursing note and vitals reviewed.   ED Course  Procedures (including critical care time) Labs Review Labs Reviewed - No data to display  Imaging Review No results found. I have personally reviewed and evaluated these images and lab results as part of my medical decision-making.   EKG Interpretation None      MDM    Final diagnoses:  URI (upper respiratory infection)  Rhinorrhea  Viral syndrome  Cough  Pharyngitis    24 y.o. female here with URI symptoms. Pt is afebrile with a clear lung exam. Mild rhinorrhea. Likely viral URI. Pt is agreeable to symptomatic treatment with close follow up with [facility] as needed but spoke at length about emergent changing or worsening of symptoms that should prompt return to ER. Pt voices understanding and is agreeable to plan. Stable at time of discharge.   BP 149/93 mmHg  Pulse 98  Temp(Src) 98.3 F (36.8 C) (Oral)  Resp 18  SpO2 98%  No orders of the defined types were placed in this encounter.     [address] [place], PA-C 10/12/15 1610  Courteney Randall An, MD 10/12/15 [phone]

## 2015-10-12 NOTE — ED Notes (Signed)
Patient states nasal congestion, chest congestion, sore throat since last Saturday.  Cough is productive, but patient is unsure of color of sputum.   Patient is blowing out green from nose.

## 2016-04-15 ENCOUNTER — Inpatient Hospital Stay (HOSPITAL_COMMUNITY)
Admission: AD | Admit: 2016-04-15 | Discharge: 2016-04-15 | Disposition: A | Discharge status: Home / Self Care | Payer: BLUE CROSS/BLUE SHIELD | Source: Ambulatory Visit | Attending: Obstetrics & Gynecology | Admitting: Obstetrics & Gynecology

## 2016-04-15 DIAGNOSIS — N912 Amenorrhea, unspecified: Secondary | ICD-10-CM | POA: Diagnosis not present

## 2016-04-15 NOTE — MAU Note (Signed)
Pt presents to MAU for pregnancy test. Pt denies any vaginal bleeding or pain 

## 2016-04-15 NOTE — MAU Provider Note (Signed)
[name] is a 25 y.o. G1P1001 at Unknown who presents to MAU today for pregnancy verification. The patient denies abdominal pain or vaginal bleeding today.   BP 147/75 mmHg  Pulse 72  Temp(Src) 97.8 F (36.6 C)  Resp 18  Wt 135.172 kg (298 lb)  LMP 03/15/2016  CONSTITUTIONAL: Well-developed, well-nourished female in no acute distress.  CARDIOVASCULAR: Regular heart rate RESPIRATORY: Normal effort NEUROLOGICAL: Alert and oriented to person, place, and time.  SKIN: Skin is warm and dry. No rash noted. Not diaphoretic. No erythema. No pallor. PSYCH: Normal mood and affect. Normal behavior. Normal judgment and thought content.  MDM Medical Screen Exam Complete  A: Amenorrhea  P: Discharge from MAU Patient advised to follow-up with [facility] for pregnancy confirmation  Monday-Thursday 8am-4pm or Friday 8am-11am Patient may return to MAU as needed or if her condition were to change or worsen   Armando ReichertHeather D Nesta Scaturro, CNM  04/15/2016 7:41 AM

## 2016-04-25 ENCOUNTER — Emergency Department (HOSPITAL_COMMUNITY)
Admission: EM | Admit: 2016-04-25 | Discharge: 2016-04-25 | Disposition: A | Discharge status: Home / Self Care | Payer: BLUE CROSS/BLUE SHIELD | Attending: Emergency Medicine | Admitting: Emergency Medicine

## 2016-04-25 ENCOUNTER — Encounter (HOSPITAL_COMMUNITY): Payer: Self-pay | Admitting: *Deleted

## 2016-04-25 DIAGNOSIS — Z8679 Personal history of other diseases of the circulatory system: Secondary | ICD-10-CM | POA: Diagnosis not present

## 2016-04-25 DIAGNOSIS — Z79899 Other long term (current) drug therapy: Secondary | ICD-10-CM | POA: Insufficient documentation

## 2016-04-25 DIAGNOSIS — J392 Other diseases of pharynx: Secondary | ICD-10-CM | POA: Diagnosis not present

## 2016-04-25 DIAGNOSIS — Z8669 Personal history of other diseases of the nervous system and sense organs: Secondary | ICD-10-CM | POA: Diagnosis not present

## 2016-04-25 DIAGNOSIS — J029 Acute pharyngitis, unspecified: Secondary | ICD-10-CM | POA: Diagnosis present

## 2016-04-25 DIAGNOSIS — F17200 Nicotine dependence, unspecified, uncomplicated: Secondary | ICD-10-CM | POA: Diagnosis not present

## 2016-04-25 DIAGNOSIS — Z791 Long term (current) use of non-steroidal anti-inflammatories (NSAID): Secondary | ICD-10-CM | POA: Diagnosis not present

## 2016-04-25 DIAGNOSIS — Z862 Personal history of diseases of the blood and blood-forming organs and certain disorders involving the immune mechanism: Secondary | ICD-10-CM | POA: Diagnosis not present

## 2016-04-25 NOTE — ED Provider Notes (Signed)
CSN: [id number]     Arrival date & time 04/25/16  2203 History   By signing my name below, I, Renetta Chalk, attest that this documentation has been prepared under the direction and in the presence of Roxy Horseman PA-C  Electronically Signed: Renetta Chalk, ED Scribe. 04/25/2016. 4:01 PM.   Chief Complaint  Patient presents with  . Sore Throat   The history is provided by the patient. No language interpreter was used.    HPI Comments: Anne Ray is a 25 y.o. female with a PMHx of sickle cell trait who presents to the Emergency Department complaining of constant, ongoing sore throat onset PTA. Pt states she was eating chicken with spicy sauce when she noted immediate irritation in her throat. She then felt a knot to the back of her throat after feeling with her finger. No aggravating or alleviating factors at this time. No recent fever, chills, or shortness of breath. No prior history of same.  PCP: No PCP Per Patient    Past Medical History  Diagnosis Date  . Migraine   . Carpal tunnel syndrome   . Sickle cell trait [facility])    Past Surgical History  Procedure Laterality Date  . No past surgeries    . Vascular surgery    . Boil      2010- BOIL  ON RIGHT  HIP   Family History  Problem Relation Age of Onset  . Diabetes Other    Social History  Substance Use Topics  . Smoking status: Current Every Day Smoker  . Smokeless tobacco: Never Used  . Alcohol Use: Yes     Comment: ocassionally   OB History    Gravida Para Term Preterm AB TAB SAB Ectopic Multiple Living   Review of Systems  Constitutional: Negative for fever and chills.  HENT: Positive for sore throat.   Respiratory: Negative for shortness of breath.       Allergies  Kiwi extract  Home Medications   Prior to Admission medications   Medication Sig Start Date End Date Taking? Authorizing Provider  BIOTIN PO Take 1 tablet by mouth daily.    Historical Provider, MD  ibuprofen  (ADVIL,MOTRIN) 800 MG tablet Take 1 tablet (800 mg total) by mouth 3 (three) times daily. 05/12/15   Gilda Crease, MD   Triage Vitals: BP 127/75 mmHg  Pulse 96  Temp(Src) 98.8 F (37.1 C) (Oral)  Resp 18  Ht  (1.727 m)  Wt 300 lb (136.079 kg)  BMI 45.63 kg/m2  SpO2 100%  LMP 04/14/2016   Physical Exam  Constitutional: She is oriented to person, place, and time. She appears well-developed and well-nourished. No distress.  HENT:  Head: Normocephalic and atraumatic.  Mild erythema to the left tonsil, appears to be traumatic in origin, likely from patient's fingernail, no laceration  Neck: Normal range of motion.  Cardiovascular: Normal rate, regular rhythm and normal heart sounds.   Pulmonary/Chest: Effort normal and breath sounds normal.  Neurological: She is alert and oriented to person, place, and time.  Skin: Skin is warm and dry. She is not diaphoretic.  Psychiatric: She has a normal mood and affect. Judgment normal.  Nursing note and vitals reviewed.   ED Course  Procedures (including critical care time)  DIAGNOSTIC STUDIES: Oxygen Saturation is 100% on RA, Normal by my interpretation.    MDM   Final diagnoses:  Throat irritation  Mild left tonsillar irritation, presumably from patient scratching it with her fingernail. No evidence of infection, swelling, airway is patent, no stridor. Patient is well-appearing. She is stable and ready for discharge.  I personally performed the services described in this documentation, which was scribed in my presence. The recorded information has been reviewed and is accurate.     Roxy Horsemanobert Oakes Mccready, PA-C 04/25/16 2240  Alvira MondayErin Schlossman, MD 04/26/16 1304

## 2016-04-25 NOTE — Discharge Instructions (Signed)
There is evidence of mild irritation to your left tonsil.  There is no evidence of infection.  This should heal in the next 24-48 hours without any complications.

## 2016-04-25 NOTE — ED Notes (Signed)
EDP at bedside, see assessment  

## 2016-04-25 NOTE — ED Notes (Signed)
[facility] and has noticed a knot on her throat.  Denies SOB, hives, etc

## 2016-04-29 ENCOUNTER — Other Ambulatory Visit: Payer: Self-pay | Admitting: Obstetrics and Gynecology

## 2016-04-29 ENCOUNTER — Inpatient Hospital Stay (HOSPITAL_COMMUNITY)
Admission: AD | Admit: 2016-04-29 | Discharge: 2016-04-29 | Disposition: A | Discharge status: Home / Self Care | Payer: BLUE CROSS/BLUE SHIELD | Source: Ambulatory Visit | Attending: Obstetrics and Gynecology | Admitting: Obstetrics and Gynecology

## 2016-04-29 ENCOUNTER — Encounter (HOSPITAL_COMMUNITY): Payer: Self-pay

## 2016-04-29 DIAGNOSIS — Z3202 Encounter for pregnancy test, result negative: Secondary | ICD-10-CM | POA: Diagnosis not present

## 2016-04-29 DIAGNOSIS — F17210 Nicotine dependence, cigarettes, uncomplicated: Secondary | ICD-10-CM | POA: Insufficient documentation

## 2016-04-29 DIAGNOSIS — D573 Sickle-cell trait: Secondary | ICD-10-CM | POA: Insufficient documentation

## 2016-04-29 DIAGNOSIS — N390 Urinary tract infection, site not specified: Secondary | ICD-10-CM | POA: Insufficient documentation

## 2016-04-29 DIAGNOSIS — N939 Abnormal uterine and vaginal bleeding, unspecified: Secondary | ICD-10-CM | POA: Diagnosis present

## 2016-04-29 LAB — URINALYSIS, ROUTINE W REFLEX MICROSCOPIC
BILIRUBIN URINE: NEGATIVE
Glucose, UA: NEGATIVE mg/dL
KETONES UR: NEGATIVE mg/dL
NITRITE: NEGATIVE
Protein, ur: 100 mg/dL — AB
SPECIFIC GRAVITY, URINE: 1.02 (ref 1.005–1.030)
pH: 5.5 (ref 5.0–8.0)

## 2016-04-29 LAB — WET PREP, GENITAL
Sperm: NONE SEEN
Trich, Wet Prep: NONE SEEN
Yeast Wet Prep HPF POC: NONE SEEN

## 2016-04-29 LAB — POCT PREGNANCY, URINE: PREG TEST UR: NEGATIVE

## 2016-04-29 LAB — URINE MICROSCOPIC-ADD ON

## 2016-04-29 MED ORDER — SULFAMETHOXAZOLE-TRIMETHOPRIM 800-160 MG PO TABS: 1.0000 | ORAL_TABLET | Freq: Two times a day (BID) | ORAL | Status: DC

## 2016-04-29 MED ORDER — PHENAZOPYRIDINE HCL 200 MG PO TABS: 200.0000 mg | ORAL_TABLET | Freq: Three times a day (TID) | ORAL | Status: DC | PRN

## 2016-04-29 NOTE — Discharge Instructions (Signed)

## 2016-04-29 NOTE — MAU Provider Note (Signed)
History     CSN: [id number]  Arrival date and time: 04/29/16 0906   First Provider Initiated Contact with Patient 04/29/16 [phone]      Chief Complaint  Patient presents with  . Abdominal Pain   HPI   Anne Ray is a 25 y.o. female presenting to MAU with abnormal vaginal spotting. On May 30th she started spotting, today she is having tearing pain from her belly button to her lower abdomen; the pain  Is worse in the center of her lower abdomen and worse when she stands up.   Denies pain at this time because she is laying still.   In April her period came and it was 2 days early, and in May her period was 7 days late, and it only lasted 2-3 days.  She and her partner are desiring pregnancy at this time.   She stopped birth control in March 2015; she was on depo X 10 years.   She is scheduled to see the [facility] this month, however came in today because she felt like she couldn't wait.   OB History    Gravida Para Term Preterm AB TAB SAB Ectopic Multiple Living   Past Medical History  Diagnosis Date  . Migraine   . Carpal tunnel syndrome   . Sickle cell trait [facility])     Past Surgical History  Procedure Laterality Date  . No past surgeries    . Vascular surgery    . Boil      2010- BOIL  ON RIGHT  HIP  . Wisdom tooth extraction      Family History  Problem Relation Age of Onset  . Diabetes Other     Social History  Substance Use Topics  . Smoking status: Current Every Day Smoker -- 0.25 packs/day    Types: Cigarettes  . Smokeless tobacco: Never Used  . Alcohol Use: Yes     Comment: ocassionally    Allergies:  Allergies  Allergen Reactions  . Kiwi Extract Swelling and Other (See Comments)    "tongue tingling"    Prescriptions prior to admission  Medication Sig Dispense Refill Last Dose  . BIOTIN PO Take 1 tablet by mouth daily.   06/05/2015 at Unknown time  . ibuprofen (ADVIL,MOTRIN) 800 MG tablet Take 1 tablet (800 mg total) by  mouth 3 (three) times daily. 21 tablet 0 Past Month at Unknown time   Results for orders placed or performed during the hospital encounter of 04/29/16 (from the past 48 hour(s))  Urinalysis, Routine w reflex microscopic (not at [facility])     Status: Abnormal   Collection Time: 04/29/16  9:15 AM  Result Value Ref Range   Color, Urine YELLOW YELLOW   APPearance CLOUDY (A) CLEAR   Specific Gravity, Urine 1.020 1.005 - 1.030   pH 5.5 5.0 - 8.0   Glucose, UA NEGATIVE NEGATIVE mg/dL   Hgb urine dipstick LARGE (A) NEGATIVE   Bilirubin Urine NEGATIVE NEGATIVE   Ketones, ur NEGATIVE NEGATIVE mg/dL   Protein, ur 130 (A) NEGATIVE mg/dL   Nitrite NEGATIVE NEGATIVE   Leukocytes, UA LARGE (A) NEGATIVE  Urine microscopic-add on     Status: Abnormal   Collection Time: 04/29/16  9:15 AM  Result Value Ref Range   Squamous Epithelial / LPF 0-5 (A) NONE SEEN   WBC, UA TOO NUMEROUS TO COUNT 0 - 5 WBC/hpf   RBC / HPF  6-30 0 - 5 RBC/hpf   Bacteria, UA RARE (A) NONE SEEN  Pregnancy, urine POC     Status: None   Collection Time: 04/29/16  9:33 AM  Result Value Ref Range   Preg Test, Ur NEGATIVE NEGATIVE    Comment:        THE SENSITIVITY OF THIS METHODOLOGY IS >24 mIU/mL   Wet prep, genital     Status: Abnormal   Collection Time: 04/29/16  9:58 AM  Result Value Ref Range   Yeast Wet Prep HPF POC NONE SEEN NONE SEEN   Trich, Wet Prep NONE SEEN NONE SEEN   Clue Cells Wet Prep HPF POC PRESENT (A) NONE SEEN   WBC, Wet Prep HPF POC MODERATE (A) NONE SEEN    Comment: FEW BACTERIA SEEN   Sperm NONE SEEN     Review of Systems  Constitutional: Negative for fever and chills.  Gastrointestinal: Negative for nausea, vomiting and abdominal pain.  Genitourinary: Positive for urgency. Negative for dysuria, frequency, hematuria and flank pain.  Musculoskeletal: Negative for back pain.   Physical Exam   Blood pressure 128/75, pulse 78, temperature 98.7 F (37.1 C), temperature source Oral, resp. rate 18,  height 5\' 8"  (1.727 m), weight 299 lb 12 oz (135.966 kg), last menstrual period 04/14/2016.  Physical Exam  Constitutional: She is oriented to person, place, and time. She appears well-developed and well-nourished. No distress.  Cardiovascular: Normal heart sounds.   GI: Soft. Normal appearance. There is tenderness in the suprapubic area. There is no rigidity, no rebound and no guarding.  Genitourinary:  Speculum exam: Vagina - Small amount of creamy discharge, no odor Cervix - No contact bleeding, no active blood noted  Bimanual exam: Cervix closed, no CMT  Uterus non tender, normal size Adnexa non tender, no masses bilaterally GC/Chlam, wet prep done Chaperone present for exam.  Musculoskeletal: Normal range of motion.  Neurological: She is alert and oriented to person, place, and time.  Skin: Skin is warm. She is not diaphoretic.  Psychiatric: Her behavior is normal.    MAU Course  Procedures  None  MDM  UA Wet prep  GC Large leukocytes, WBC; will treat for UTI.   Clue cells present; patient asymptomatic; will defer treatment for BV at this time.   Assessment and Plan   A:  1. Abnormal vaginal bleeding   2. UTI (urinary tract infection), uncomplicated     P:  Discharge home in stable condition  Rx: Bactrim, pyridium Ok to use ibuprofen at home, as indicated on the bottle.  If symptoms worsen return to [facility]U or [facility]  Keep appointment in the [facility] as scheduled.    Duane LopeJennifer I Rasch, NP 04/29/2016 7:51 PM   Called the patient at 7:45 tonight; in review of her chart prior to closing I realized I had not called in the patient's RX for UTI. Patient called and notified and she states that she is now having dysuria at the end of her urine stream. Pyridium called in as well. Patient instructed to started antibiotics tonight and to return to MAU if needed.   Duane LopeJennifer I Rasch, NP 04/29/2016 7:53 PM

## 2016-04-29 NOTE — MAU Note (Signed)
Periods have been regular in the past but the last two months has been irregular.  Started spotting again on 5/30.  Feels like she has a torn muscle from umbilicus to pelvis.

## 2016-04-30 ENCOUNTER — Emergency Department (HOSPITAL_COMMUNITY): Payer: BLUE CROSS/BLUE SHIELD

## 2016-04-30 ENCOUNTER — Observation Stay (HOSPITAL_COMMUNITY)
Admission: EM | Admit: 2016-04-30 | Discharge: 2016-05-01 | Disposition: A | Discharge status: Home / Self Care | Payer: BLUE CROSS/BLUE SHIELD | Attending: Internal Medicine | Admitting: Internal Medicine

## 2016-04-30 ENCOUNTER — Encounter (HOSPITAL_COMMUNITY): Payer: Self-pay | Admitting: Adult Health

## 2016-04-30 DIAGNOSIS — D573 Sickle-cell trait: Secondary | ICD-10-CM | POA: Insufficient documentation

## 2016-04-30 DIAGNOSIS — N12 Tubulo-interstitial nephritis, not specified as acute or chronic: Secondary | ICD-10-CM | POA: Diagnosis not present

## 2016-04-30 DIAGNOSIS — R1011 Right upper quadrant pain: Secondary | ICD-10-CM | POA: Diagnosis not present

## 2016-04-30 DIAGNOSIS — Z6841 Body Mass Index (BMI) 40.0 and over, adult: Secondary | ICD-10-CM | POA: Diagnosis not present

## 2016-04-30 DIAGNOSIS — E669 Obesity, unspecified: Secondary | ICD-10-CM | POA: Diagnosis present

## 2016-04-30 DIAGNOSIS — F17210 Nicotine dependence, cigarettes, uncomplicated: Secondary | ICD-10-CM | POA: Diagnosis not present

## 2016-04-30 DIAGNOSIS — Z79899 Other long term (current) drug therapy: Secondary | ICD-10-CM | POA: Diagnosis not present

## 2016-04-30 LAB — URINALYSIS, ROUTINE W REFLEX MICROSCOPIC
Bilirubin Urine: NEGATIVE
Glucose, UA: NEGATIVE mg/dL
KETONES UR: NEGATIVE mg/dL
Nitrite: NEGATIVE
PROTEIN: 100 mg/dL — AB
Specific Gravity, Urine: 1.013 (ref 1.005–1.030)
pH: 6.5 (ref 5.0–8.0)

## 2016-04-30 LAB — MRSA PCR SCREENING: MRSA by PCR: NEGATIVE

## 2016-04-30 LAB — CBC
HEMATOCRIT: 41.7 % (ref 36.0–46.0)
Hemoglobin: 14.1 g/dL (ref 12.0–15.0)
MCH: 28.3 pg (ref 26.0–34.0)
MCHC: 33.8 g/dL (ref 30.0–36.0)
MCV: 83.7 fL (ref 78.0–100.0)
PLATELETS: 233 10*3/uL (ref 150–400)
RBC: 4.98 MIL/uL (ref 3.87–5.11)
RDW: 13.8 % (ref 11.5–15.5)
WBC: 16.9 10*3/uL — AB (ref 4.0–10.5)

## 2016-04-30 LAB — BASIC METABOLIC PANEL
Anion gap: 10 (ref 5–15)
BUN: 8 mg/dL (ref 6–20)
CALCIUM: 9.5 mg/dL (ref 8.9–10.3)
CO2: 21 mmol/L — ABNORMAL LOW (ref 22–32)
Chloride: 105 mmol/L (ref 101–111)
Creatinine, Ser: 0.81 mg/dL (ref 0.44–1.00)
GFR calc Af Amer: >60 mL/min (ref >60)
Glucose, Bld: 88 mg/dL (ref 65–99)
POTASSIUM: 3.7 mmol/L (ref 3.5–5.1)
SODIUM: 136 mmol/L (ref 135–145)

## 2016-04-30 LAB — HEPATIC FUNCTION PANEL
ALBUMIN: 3.7 g/dL (ref 3.5–5.0)
ALT: 11 U/L — ABNORMAL LOW (ref 14–54)
AST: 19 U/L (ref 15–41)
Alkaline Phosphatase: 46 U/L (ref 38–126)
Bilirubin, Direct: <0.1 mg/dL — ABNORMAL LOW (ref 0.1–0.5)
TOTAL PROTEIN: 7.6 g/dL (ref 6.5–8.1)
Total Bilirubin: 0.2 mg/dL — ABNORMAL LOW (ref 0.3–1.2)

## 2016-04-30 LAB — URINE MICROSCOPIC-ADD ON

## 2016-04-30 LAB — GC/CHLAMYDIA PROBE AMP (~~LOC~~) NOT AT ARMC
Chlamydia: NEGATIVE
NEISSERIA GONORRHEA: NEGATIVE

## 2016-04-30 LAB — POC URINE PREG, ED: PREG TEST UR: NEGATIVE

## 2016-04-30 LAB — LIPASE, BLOOD: LIPASE: 20 U/L (ref 11–51)

## 2016-04-30 MED ORDER — PHENAZOPYRIDINE HCL 200 MG PO TABS
200.0000 mg | ORAL_TABLET | Freq: Three times a day (TID) | ORAL | Status: DC | PRN
  Filled 2016-04-30: qty 1

## 2016-04-30 MED ORDER — HYDROMORPHONE HCL 1 MG/ML IJ SOLN
0.5000 mg | INTRAMUSCULAR | Status: DC | PRN
  Administered 2016-04-30 – 2016-05-01 (×2): 0.5 mg via INTRAVENOUS
  Filled 2016-04-30 (×2): qty 1

## 2016-04-30 MED ORDER — POLYETHYLENE GLYCOL 3350 17 G PO PACK: 17.0000 g | PACK | Freq: Every day | ORAL | Status: DC | PRN

## 2016-04-30 MED ORDER — DEXTROSE 5 % IV SOLN
1.0000 g | INTRAVENOUS | Status: DC
  Administered 2016-05-01: 1 g via INTRAVENOUS
  Filled 2016-04-30 (×2): qty 10

## 2016-04-30 MED ORDER — ONDANSETRON HCL 4 MG/2ML IJ SOLN
4.0000 mg | Freq: Four times a day (QID) | INTRAMUSCULAR | Status: DC | PRN
  Administered 2016-04-30 – 2016-05-01 (×2): 4 mg via INTRAVENOUS
  Filled 2016-04-30 (×2): qty 2

## 2016-04-30 MED ORDER — BISACODYL 5 MG PO TBEC: 5.0000 mg | DELAYED_RELEASE_TABLET | Freq: Every day | ORAL | Status: DC | PRN

## 2016-04-30 MED ORDER — ACETAMINOPHEN 650 MG RE SUPP: 650.0000 mg | Freq: Four times a day (QID) | RECTAL | Status: DC | PRN

## 2016-04-30 MED ORDER — ENOXAPARIN SODIUM 80 MG/0.8ML ~~LOC~~ SOLN
70.0000 mg | SUBCUTANEOUS | Status: DC
  Administered 2016-04-30: 70 mg via SUBCUTANEOUS
  Filled 2016-04-30: qty 0.8

## 2016-04-30 MED ORDER — MORPHINE SULFATE (PF) 2 MG/ML IV SOLN
2.0000 mg | INTRAVENOUS | Status: DC | PRN
  Administered 2016-04-30: 2 mg via INTRAVENOUS
  Filled 2016-04-30: qty 1

## 2016-04-30 MED ORDER — ONDANSETRON HCL 4 MG PO TABS: 4.0000 mg | ORAL_TABLET | Freq: Four times a day (QID) | ORAL | Status: DC | PRN

## 2016-04-30 MED ORDER — DEXTROSE 5 % IV SOLN
1.0000 g | Freq: Once | INTRAVENOUS | Status: AC
  Administered 2016-04-30: 1 g via INTRAVENOUS
  Filled 2016-04-30: qty 10

## 2016-04-30 MED ORDER — TRAZODONE HCL 50 MG PO TABS: 25.0000 mg | ORAL_TABLET | Freq: Every evening | ORAL | Status: DC | PRN

## 2016-04-30 MED ORDER — SODIUM CHLORIDE 0.9 % IV SOLN
INTRAVENOUS | Status: DC
  Administered 2016-04-30 – 2016-05-01 (×3): via INTRAVENOUS

## 2016-04-30 MED ORDER — MORPHINE SULFATE (PF) 4 MG/ML IV SOLN
6.0000 mg | Freq: Once | INTRAVENOUS | Status: AC
  Administered 2016-04-30: 6 mg via INTRAVENOUS
  Filled 2016-04-30: qty 2

## 2016-04-30 MED ORDER — ACETAMINOPHEN 325 MG PO TABS: 650.0000 mg | ORAL_TABLET | Freq: Four times a day (QID) | ORAL | Status: DC | PRN

## 2016-04-30 NOTE — ED Notes (Addendum)
Presents with right sided pain from breast down into pelvis began yesterday-[facility] and was told she had a lot of bacteria in urine placed on Sulfa-took one dose at 8pm. Pain woke her from sleep today. She tried tylenol at 10pm with no relief. She has UA from 6/1 in results.

## 2016-04-30 NOTE — ED Provider Notes (Signed)
CSN: [id number]     Arrival date & time 04/30/16  0251 History  By signing my name below, I, Renetta Chalk, attest that this documentation has been prepared under the direction and in the presence of Tomasita Crumble, MD. Electronically signed, Renetta Chalk, ED Scribe. 04/30/2016. 4:02 AM.      Chief Complaint  Patient presents with  . Flank Pain   The history is provided by the patient. No language interpreter was used.   HPI Comments: Anne Ray is a 25 y.o. female who presents to the Emergency Department complaining of waxing and waning, gradually worseneing right sided abdominal pain onset yesterday. Pt describes pain as "contractions". Pt also reports associated right flank pain, dysuria and nausea. Pt reports she was evaluated at [facility] yesterday where urinalysis was performed revealing a large amount of bacteria. Pt was placed on Bactrim with first dose taken at 8:30pm this evening. Pt denies any vomiting, diarrhea or irregular vaginal discharge.  Past Medical History  Diagnosis Date  . Migraine   . Carpal tunnel syndrome   . Sickle cell trait [facility])    Past Surgical History  Procedure Laterality Date  . No past surgeries    . Vascular surgery    . Boil      2010- BOIL  ON RIGHT  HIP  . Wisdom tooth extraction     Family History  Problem Relation Age of Onset  . Diabetes Other    Social History  Substance Use Topics  . Smoking status: Current Every Day Smoker -- 0.25 packs/day    Types: Cigarettes  . Smokeless tobacco: Never Used  . Alcohol Use: Yes     Comment: ocassionally   OB History    Gravida Para Term Preterm AB TAB SAB Ectopic Multiple Living   1 1 1       1      Review of Systems  Gastrointestinal: Positive for nausea and abdominal pain (right). Negative for vomiting and diarrhea.  Genitourinary: Positive for dysuria and flank pain (right). Negative for vaginal discharge.  All other systems reviewed and are negative.   Allergies  Kiwi  extract  Home Medications   Prior to Admission medications   Medication Sig Start Date End Date Taking? Authorizing Provider  acetaminophen (TYLENOL) 325 MG tablet Take 325 mg by mouth every 6 (six) hours as needed for moderate pain.    Historical Provider, MD  BIOTIN PO Take 1 tablet by mouth daily.    Historical Provider, MD  ibuprofen (ADVIL,MOTRIN) 800 MG tablet Take 1 tablet (800 mg total) by mouth 3 (three) times daily. Patient taking differently: Take 800 mg by mouth every 8 (eight) hours as needed for moderate pain.  05/12/15   Gilda Crease, MD  phenazopyridine (PYRIDIUM) 200 MG tablet Take 1 tablet (200 mg total) by mouth 3 (three) times daily as needed for pain. 04/29/16   Duane Lope, NP  sulfamethoxazole-trimethoprim (BACTRIM DS,SEPTRA DS) 800-160 MG tablet Take 1 tablet by mouth 2 (two) times daily. 04/29/16   Duane Lope, NP   BP 116/89 mmHg  Pulse 87  Temp(Src) 98.5 F (36.9 C) (Oral)  Resp 18  SpO2 100%  LMP 04/14/2016 Physical Exam  Constitutional: She is oriented to person, place, and time. She appears well-developed and well-nourished. No distress.  Distressed, Tearful  HENT:  Head: Normocephalic and atraumatic.  Nose: Nose normal.  Mouth/Throat: Oropharynx is clear and moist. No oropharyngeal exudate.  Eyes: Conjunctivae and EOM are normal. Pupils  are equal, round, and reactive to light. No scleral icterus.  Neck: Normal range of motion. Neck supple. No JVD present. No tracheal deviation present. No thyromegaly present.  Cardiovascular: Normal rate, regular rhythm and normal heart sounds.  Exam reveals no gallop and no friction rub.   No murmur heard. Pulmonary/Chest: Effort normal and breath sounds normal. No respiratory distress. She has no wheezes. She exhibits no tenderness.  Abdominal: Soft. Bowel sounds are normal. She exhibits no distension and no mass. There is tenderness. There is no rebound and no guarding.  RUQ TTP, Positive Murphy's sign.  RLQ tenderness   Musculoskeletal: Normal range of motion. She exhibits no edema or tenderness.  Lymphadenopathy:    She has no cervical adenopathy.  Neurological: She is alert and oriented to person, place, and time. No cranial nerve deficit. She exhibits normal muscle tone.  Skin: Skin is warm and dry. No rash noted. No erythema. No pallor.  Nursing note and vitals reviewed.   ED Course  Procedures  DIAGNOSTIC STUDIES: Oxygen Saturation is 100% on RA, normal by my interpretation.  COORDINATION OF CARE: 3:58 AM-Will order blood work. Discussed treatment plan with pt at bedside and pt agreed to plan.   Labs Review Labs Reviewed  URINALYSIS, ROUTINE W REFLEX MICROSCOPIC (NOT AT [facility]) - Abnormal; Notable for the following:    APPearance TURBID (*)    Hgb urine dipstick LARGE (*)    Protein, ur 100 (*)    Leukocytes, UA LARGE (*)    All other components within normal limits  BASIC METABOLIC PANEL - Abnormal; Notable for the following:    CO2 21 (*)    All other components within normal limits  CBC - Abnormal; Notable for the following:    WBC 16.9 (*)    All other components within normal limits  URINE MICROSCOPIC-ADD ON - Abnormal; Notable for the following:    Squamous Epithelial / LPF 0-5 (*)    Bacteria, UA FEW (*)    All other components within normal limits  HEPATIC FUNCTION PANEL  LIPASE, BLOOD  POC URINE PREG, ED    Imaging Review Koreas Abdomen Complete  04/30/2016  CLINICAL DATA:  Right-sided abdominal pain, onset yesterday. EXAM: ABDOMEN ULTRASOUND COMPLETE COMPARISON:  CT 05/17/2009 FINDINGS: Gallbladder: Physiologically distended. No gallstones or wall thickening visualized. No sonographic Murphy sign noted by sonographer. Common bile duct: Diameter: 2 mm, normal. Liver: No focal lesion identified. Borderline increased in parenchymal echogenicity. Normal directional flow in the main portal vein. IVC: No abnormality visualized. Pancreas: Visualized portion unremarkable.  Spleen: Size and appearance within normal limits. Right Kidney: Length: 12.1 cm. Echogenicity within normal limits. No mass or hydronephrosis visualized. No shadowing nephrolithiasis. Left Kidney: Length: 11.4 cm. Echogenicity within normal limits. No mass or hydronephrosis visualized. No shadowing nephrolithiasis. Abdominal aorta: No aneurysm visualized.  Distal portion obscured. Other findings: None.  No ascites. IMPRESSION: 1. Normal sonographic appearance of the gallbladder. No biliary dilatation. 2. Borderline hepatic steatosis. 3. Normal sonographic appearance of the right kidney. Electronically Signed   By: Rubye OaksMelanie  Ehinger M.D.   On: 04/30/2016 05:05   I have personally reviewed and evaluated these images and lab results as part of my medical decision-making.   EKG Interpretation None      MDM   Final diagnoses:  None    Patient presents to the ED for flank and abdominal pain that has worsened since being placed on abx for UTI.  She was on bactrim and likely has resistance.  She was given morphine and ceftriaxone for treatment.  WBC is up as well. Patient will require admission for failure of outpt therapy.  I spoke with Dr. Toniann Fail who accepts for admission  I personally performed the services described in this documentation, which was scribed in my presence. The recorded information has been reviewed and is accurate.       Tomasita Crumble, MD 04/30/16 [phone]

## 2016-04-30 NOTE — H&P (Signed)
History and Physical    RIKITA GRABERT ZOX:[id number] DOB: Aug 07, 1991 DOA: 04/30/2016  PCP: No PCP Per Patient  Patient coming from:   home    Chief Complaint:  Abdominal pain  / right flank pain  HPI: Anne Ray is a 25 y.o. female with medical history significant for migraines. Two days ago patient developed mid lower abdominal pain which she thought was secondary to pulled muscle from working out. No dysuria but a small amount of hematuria.  Abdominal pain worse just after urination. The constant mid lower abdominal pain expanded to right flank and she was seen at [facility] yesterday.  Diagnosed with UTI, prescribed bactrim. Patient took one dose of Bactrim but then came to [facility] ED for ongoing patient and nausea. No history of UTI.    ED Course:  Temp 99.1, hemodynamically stable. Morphine,  Rocephin No acute findings on abdominal ultrasound WBC 16 point 9  Urinalysis with large amount of leukocytes, too numerous to count red blood cells and white blood cells.  Preg Test negative.  Review of Systems: Irregular menses. As per HPI, otherwise 10 point review of systems negative.    Past Medical History  Diagnosis Date  . Migraine   . Carpal tunnel syndrome   . Sickle cell trait [facility])     Past Surgical History  Procedure Laterality Date  . No past surgeries    . Vascular surgery    . Boil      2010- BOIL  ON RIGHT  HIP  . Wisdom tooth extraction       reports that she has been smoking Cigarettes.  She has been smoking about 0.25 packs per day. She has never used smokeless tobacco. She reports that she drinks alcohol. She reports that she uses illicit drugs (Marijuana).  Allergies  Allergen Reactions  . Kiwi Extract Swelling and Other (See Comments)    "tongue tingling"    Family History  Problem Relation Age of Onset  . Diabetes Other     Prior to Admission medications   Medication Sig Start Date End Date Taking? Authorizing Provider    acetaminophen (TYLENOL) 325 MG tablet Take 325 mg by mouth every 6 (six) hours as needed for moderate pain.   Yes Historical Provider, MD  BIOTIN PO Take 1 tablet by mouth daily.   Yes Historical Provider, MD  ibuprofen (ADVIL,MOTRIN) 800 MG tablet Take 1 tablet (800 mg total) by mouth 3 (three) times daily. Patient taking differently: Take 800 mg by mouth every 8 (eight) hours as needed for moderate pain.  05/12/15  Yes Gilda Crease, MD  phenazopyridine (PYRIDIUM) 200 MG tablet Take 1 tablet (200 mg total) by mouth 3 (three) times daily as needed for pain. 04/29/16  Yes Duane Lope, NP  sulfamethoxazole-trimethoprim (BACTRIM DS,SEPTRA DS) 800-160 MG tablet Take 1 tablet by mouth 2 (two) times daily. 04/29/16  Yes Duane Lope, NP    Physical Exam: Filed Vitals:   04/30/16 0330 04/30/16 0345 04/30/16 0530 04/30/16 0637  BP: 117/67 116/58 116/78 121/51  Pulse: 81 77 74 70  Temp:    98.3 F (36.8 C)  TempSrc:    Oral  Resp:    17  Height:     (1.727 m)  Weight:    135.626 kg (299 lb)  SpO2: 99% 100% 98% 100%    Constitutional:  Pleasant obese black female in NAD, calm, comfortable Filed Vitals:   04/30/16 0330 04/30/16 0345 04/30/16 0530 04/30/16 8119  BP: 117/67 116/58 116/78 121/51  Pulse: 81 77 74 70  Temp:    98.3 F (36.8 C)  TempSrc:    Oral  Resp:    17  Height:    5\' 8"  (1.727 m)  Weight:    135.626 kg (299 lb)  SpO2: 99% 100% 98% 100%   Eyes: PER, lids and conjunctivae normal ENMT: Mucous membranes are moist. Posterior pharynx clear of any exudate or lesions.Normal dentition.  Neck: normal, supple, no masses Respiratory: clear to auscultation bilaterally, no wheezing, no crackles. Normal respiratory effort. No accessory muscle use.  Cardiovascular: Regular rate and rhythm, no murmurs / rubs / gallops. No extremity edema. 2+ pedal pulses. No carotid bruits.  Abdomen: no tenderness, no masses palpated. No hepatomegaly. Bowel sounds positive.   Musculoskeletal: no clubbing / cyanosis. No joint deformity upper and lower extremities. Good ROM, no contractures. Normal muscle tone.  Skin: tatoos, no rashes, lesions, ulcers.  Neurologic: CN 2-12 grossly intact. Sensation intact, Strength 5/5 in all 4.  Psychiatric: Normal judgment and insight. Alert and oriented x 3. Normal mood.    Labs on Admission: I have personally reviewed following labs and imaging studies   Urine analysis:    Component Value Date/Time   COLORURINE YELLOW 04/30/2016 0345   APPEARANCEUR TURBID* 04/30/2016 0345   LABSPEC 1.013 04/30/2016 0345   PHURINE 6.5 04/30/2016 0345   GLUCOSEU NEGATIVE 04/30/2016 0345   HGBUR LARGE* 04/30/2016 0345   BILIRUBINUR NEGATIVE 04/30/2016 0345   KETONESUR NEGATIVE 04/30/2016 0345   PROTEINUR 100* 04/30/2016 0345   UROBILINOGEN 0.2 06/05/2015 1545   NITRITE NEGATIVE 04/30/2016 0345   LEUKOCYTESUR LARGE* 04/30/2016 0345    Radiological Exams on Admission: Koreas Abdomen Complete  04/30/2016  CLINICAL DATA:  Right-sided abdominal pain, onset yesterday. EXAM: ABDOMEN ULTRASOUND COMPLETE COMPARISON:  CT 05/17/2009 FINDINGS: Gallbladder: Physiologically distended. No gallstones or wall thickening visualized. No sonographic Murphy sign noted by sonographer. Common bile duct: Diameter: 2 mm, normal. Liver: No focal lesion identified. Borderline increased in parenchymal echogenicity. Normal directional flow in the main portal vein. IVC: No abnormality visualized. Pancreas: Visualized portion unremarkable. Spleen: Size and appearance within normal limits. Right Kidney: Length: 12.1 cm. Echogenicity within normal limits. No mass or hydronephrosis visualized. No shadowing nephrolithiasis. Left Kidney: Length: 11.4 cm. Echogenicity within normal limits. No mass or hydronephrosis visualized. No shadowing nephrolithiasis. Abdominal aorta: No aneurysm visualized.  Distal portion obscured. Other findings: None.  No ascites. IMPRESSION: 1. Normal  sonographic appearance of the gallbladder. No biliary dilatation. 2. Borderline hepatic steatosis. 3. Normal sonographic appearance of the right kidney. Electronically Signed   By: Rubye OaksMelanie  Ehinger M.D.   On: 04/30/2016 05:05    Assessment/Plan     Pyelonephritis. -Place in Observation -Medical bed            -follow up on blood and urine cultures -continue Rocephin -supportive care -maintenance IV fluids  DVT prophylaxis:   Lovenox  Code Status:   Full code   Family Communication:   none.  Disposition Plan:  Discharge home in 23-48 hours              Consults called:   none  Admission status:  Observation - Medical bed  Willette ClusterPaula Johnnathan Hagemeister NP [facility] Pager [phone]  If 7PM-7AM, please contact night-coverage [email or web address] Password [email or web address]  04/30/2016, 7:32 AM

## 2016-04-30 NOTE — Plan of Care (Signed)
25 year old female presents to the ER because of persistent right flank pain. Patient was treated as outpatient for UTI despite the patient is having persistent pain and has been admitted for IV antibiotics for pyelonephritis and for observation. Sonogram of abdomen does not show anything acute.  Anne MiniumArshad Ray Anne Ray.

## 2016-04-30 NOTE — Progress Notes (Signed)
   04/30/16 1000  Clinical Encounter Type  Visited With Patient  Visit Type Initial;Spiritual support  Referral From Other (Comment)  Consult/Referral To (This was a cold call visit)    Chaplain's initial visit with patient. Intent was to inform patient of the availability of Chaplain services 24/7 and to see if there were needs. There were none at this time. I will follow up with patient as needed.  Emelda FearBenjamin T Toluwani Ruder Pager: [phone]

## 2016-05-01 ENCOUNTER — Encounter (HOSPITAL_COMMUNITY): Payer: Self-pay

## 2016-05-01 DIAGNOSIS — R1011 Right upper quadrant pain: Secondary | ICD-10-CM | POA: Diagnosis not present

## 2016-05-01 DIAGNOSIS — N12 Tubulo-interstitial nephritis, not specified as acute or chronic: Secondary | ICD-10-CM | POA: Diagnosis not present

## 2016-05-01 DIAGNOSIS — E669 Obesity, unspecified: Secondary | ICD-10-CM | POA: Diagnosis present

## 2016-05-01 LAB — CBC
HEMATOCRIT: 38.8 % (ref 36.0–46.0)
Hemoglobin: 12.7 g/dL (ref 12.0–15.0)
MCH: 27.5 pg (ref 26.0–34.0)
MCHC: 32.7 g/dL (ref 30.0–36.0)
MCV: 84.2 fL (ref 78.0–100.0)
Platelets: 209 10*3/uL (ref 150–400)
RBC: 4.61 MIL/uL (ref 3.87–5.11)
RDW: 14 % (ref 11.5–15.5)
WBC: 9 10*3/uL (ref 4.0–10.5)

## 2016-05-01 LAB — BASIC METABOLIC PANEL
ANION GAP: 7 (ref 5–15)
CALCIUM: 9 mg/dL (ref 8.9–10.3)
CO2: 23 mmol/L (ref 22–32)
CREATININE: 0.77 mg/dL (ref 0.44–1.00)
Chloride: 106 mmol/L (ref 101–111)
GFR calc Af Amer: >60 mL/min (ref >60)
GFR calc non Af Amer: >60 mL/min (ref >60)
GLUCOSE: 84 mg/dL (ref 65–99)
Potassium: 4.2 mmol/L (ref 3.5–5.1)
Sodium: 136 mmol/L (ref 135–145)

## 2016-05-01 MED ORDER — POLYETHYLENE GLYCOL 3350 17 G PO PACK: 17.0000 g | PACK | Freq: Every day | ORAL | Status: DC | PRN

## 2016-05-01 MED ORDER — CIPROFLOXACIN HCL 500 MG PO TABS: 500.0000 mg | ORAL_TABLET | Freq: Two times a day (BID) | ORAL | Status: DC

## 2016-05-01 MED ORDER — OXYCODONE HCL 5 MG PO CAPS: 5.0000 mg | ORAL_CAPSULE | ORAL | Status: DC | PRN

## 2016-05-01 MED ORDER — PROMETHAZINE HCL 12.5 MG PO TABS: 12.5000 mg | ORAL_TABLET | Freq: Four times a day (QID) | ORAL | Status: DC | PRN

## 2016-05-01 NOTE — Discharge Summary (Addendum)
Discharge Summary  Anne Ray:[id number] DOB: October 28, 1991  PCP: No PCP Per Patient/ referral given for patient to establish with Dr. Sharee Holster, family medicine  Admit date: 04/30/2016 Discharge date: 05/01/2016  Time spent: 25 minutes   Recommendations for Outpatient Follow-up:  1. New medication: OxyIR 5 mg every 4 hours as needed for pain 2. New medication: Phenergan 12.5 by mouth every 6 when necessary for nausea 3. New medication: Cipro 500 mg by mouth twice a day 9 days   Discharge Diagnoses:  Active Hospital Problems   Diagnosis Date Noted  . Pyelonephritis 04/30/2016  . RUQ abdominal pain     Resolved Hospital Problems   Diagnosis Date Noted Date Resolved  No resolved problems to display.    Discharge Condition: Improved, being discharged home   Diet recommendation: Regular diet   Filed Vitals:   05/01/16 0528 05/01/16 0821  BP: 97/48 99/59  Pulse: 50 76  Temp: 98 F (36.7 C) 97.9 F (36.6 C)  Resp: 18 18    History of present illness:  25 year old female with no past mental history admitted on 6/2 after having 2 days of abdominal pain worse after urination extending to the flank. Patient seen the day prior to admission and given Bactrim for her UTI. However with ongoing pain and nausea, came to the emergency room on 6/2 and was admitted for pyelonephritis . Abdominal ultrasound was unrevealing for other causes.  Hospital Course:  Active Problems:   Pyelonephritis causing RUQ abdominal pain patient started on IV Rocephin.: White blood cell count and her previous ER visit was at 16.9. By this morning, white count down to 9. Patient afebrile. Minimal back soreness. Felt to be stable and comfortable with discharge so she was sent home on 9 more days of antibiotic by mouth plus by mouth [place] for pain and nausea.  Morbid obesity: Patient meets criteria with BMI greater than 40.   Procedures:  None   Consultations:  None   Discharge Exam: BP 99/59  mmHg  Pulse 76  Temp(Src) 97.9 F (36.6 C) (Oral)  Resp 18  Ht 5\' 8"  (1.727 m)  Wt 135.626 kg (299 lb)  BMI 45.47 kg/m2  SpO2 99%  LMP 04/14/2016  General: Alert and oriented 3, no acute distress   Cardiovascular: Regular rate and rhythm, S1-S2  Respiratory: clear to auscultation bilaterally Back: No flank pain   Discharge Instructions You were cared for by a hospitalist during your hospital stay. If you have any questions about your discharge medications or the care you received while you were in the hospital after you are discharged, you can call the unit and asked to speak with the hospitalist on call if the hospitalist that took care of you is not available. Once you are discharged, your primary care physician will handle any further medical issues. Please note that NO REFILLS for any discharge medications will be authorized once you are discharged, as it is imperative that you return to your primary care physician (or establish a relationship with a primary care physician if you do not have one) for your aftercare needs so that they can reassess your need for medications and monitor your lab values.  Discharge Instructions    Diet general    Complete by:  As directed      Increase activity slowly    Complete by:  As directed             Medication List    STOP taking these medications  sulfamethoxazole-trimethoprim 800-160 MG tablet  Commonly known as:  BACTRIM DS,SEPTRA DS      TAKE these medications        acetaminophen 325 MG tablet  Commonly known as:  TYLENOL  Take 325 mg by mouth every 6 (six) hours as needed for moderate pain.     BIOTIN PO  Take 1 tablet by mouth daily.     ciprofloxacin 500 MG tablet  Commonly known as:  CIPRO  Take 1 tablet (500 mg total) by mouth 2 (two) times daily.     ibuprofen 800 MG tablet  Commonly known as:  ADVIL,MOTRIN  Take 1 tablet (800 mg total) by mouth 3 (three) times daily.     oxycodone 5 MG capsule    Commonly known as:  OXY-IR  Take 1 capsule (5 mg total) by mouth every 4 (four) hours as needed.     phenazopyridine 200 MG tablet  Commonly known as:  PYRIDIUM  Take 1 tablet (200 mg total) by mouth 3 (three) times daily as needed for pain.     polyethylene glycol packet  Commonly known as:  MIRALAX / GLYCOLAX  Take 17 g by mouth daily as needed for mild constipation.     promethazine 12.5 MG tablet  Commonly known as:  PHENERGAN  Take 1 tablet (12.5 mg total) by mouth every 6 (six) hours as needed for nausea or vomiting.       Allergies  Allergen Reactions  . Kiwi Extract Swelling and Other (See Comments)    "tongue tingling"       Follow-up Information    Call Thomasene Lot, DO.   Specialty:  Family Medicine   Why:  To establish with new primary care doctor   Contact information:   [address] [place] [address] [phone]        The results of significant diagnostics from this hospitalization (including imaging, microbiology, ancillary and laboratory) are listed below for reference.    Significant Diagnostic Studies: US Abdomen Complete  04/30/2016  CLINICAL DATA:  Right-sided abdominal pain, onset yesterday. EXAM: ABDOMEN ULTRASOUND COMPLETE COMPARISON:  CT 05/17/2009 FINDINGS: Gallbladder: Physiologically distended. No gallstones or wall thickening visualized. No sonographic Murphy sign noted by sonographer. Common bile duct: Diameter: 2 mm, normal. Liver: No focal lesion identified. Borderline increased in parenchymal echogenicity. Normal directional flow in the main portal vein. IVC: No abnormality visualized. Pancreas: Visualized portion unremarkable. Spleen: Size and appearance within normal limits. Right Kidney: Length: 12.1 cm. Echogenicity within normal limits. No mass or hydronephrosis visualized. No shadowing nephrolithiasis. Left Kidney: Length: 11.4 cm. Echogenicity within normal limits. No mass or hydronephrosis visualized. No shadowing  nephrolithiasis. Abdominal aorta: No aneurysm visualized.  Distal portion obscured. Other findings: None.  No ascites. IMPRESSION: 1. Normal sonographic appearance of the gallbladder. No biliary dilatation. 2. Borderline hepatic steatosis. 3. Normal sonographic appearance of the right kidney. Electronically Signed   By: Rubye Oaks M.D.   On: 04/30/2016 05:05    Microbiology: Recent Results (from the past 240 hour(s))  Wet prep, genital     Status: Abnormal   Collection Time: 04/29/16  9:58 AM  Result Value Ref Range Status   Yeast Wet Prep HPF POC NONE SEEN NONE SEEN Final   Trich, Wet Prep NONE SEEN NONE SEEN Final   Clue Cells Wet Prep HPF POC PRESENT (A) NONE SEEN Final   WBC, Wet Prep HPF POC MODERATE (A) NONE SEEN Final    Comment: FEW BACTERIA SEEN  Sperm NONE SEEN  Final  MRSA PCR Screening     Status: None   Collection Time: 04/30/16  6:55 AM  Result Value Ref Range Status   MRSA by PCR NEGATIVE NEGATIVE Final    Comment:        The GeneXpert MRSA Assay (FDA approved for NASAL specimens only), is one component of a comprehensive MRSA colonization surveillance program. It is not intended to diagnose MRSA infection nor to guide or monitor treatment for MRSA infections.   Culture, blood (Routine X 2) w Reflex to ID Panel     Status: None (Preliminary result)   Collection Time: 04/30/16 11:00 AM  Result Value Ref Range Status   Specimen Description BLOOD LEFT HAND  Final   Special Requests BOTTLES DRAWN AEROBIC ONLY 5CC  Final   Culture NO GROWTH < 24 HOURS  Final   Report Status PENDING  Incomplete  Culture, blood (Routine X 2) w Reflex to ID Panel     Status: None (Preliminary result)   Collection Time: 04/30/16 11:10 AM  Result Value Ref Range Status   Specimen Description BLOOD LEFT HAND  Final   Special Requests BOTTLES DRAWN AEROBIC ONLY 5CC  Final   Culture NO GROWTH < 24 HOURS  Final   Report Status PENDING  Incomplete     Labs: Basic Metabolic  Panel:  Recent Labs Lab 04/30/16 0315 05/01/16 0609  NA 136 136  K 3.7 4.2  CL 105 106  CO2 21* 23  GLUCOSE 88 84  BUN 8 <5*  CREATININE 0.81 0.77  CALCIUM 9.5 9.0   Liver Function Tests:  Recent Labs Lab 04/30/16 0315  AST 19  ALT 11*  ALKPHOS 46  BILITOT 0.2*  PROT 7.6  ALBUMIN 3.7    Recent Labs Lab 04/30/16 0315  LIPASE 20   No results for input(s): AMMONIA in the last 168 hours. CBC:  Recent Labs Lab 04/30/16 0315 05/01/16 0609  WBC 16.9* 9.0  HGB 14.1 12.7  HCT 41.7 38.8  MCV 83.7 84.2  PLT 233 209   Cardiac Enzymes: No results for input(s): CKTOTAL, CKMB, CKMBINDEX, TROPONINI in the last 168 hours. BNP: BNP (last 3 results) No results for input(s): BNP in the last 8760 hours.  ProBNP (last 3 results) No results for input(s): PROBNP in the last 8760 hours.  CBG: No results for input(s): GLUCAP in the last 168 hours.     Signed:  Hollice EspyKRISHNAN,Kelechi Astarita K, MD [facility] 05/01/2016, 12:55 PM

## 2016-05-01 NOTE — Progress Notes (Signed)
Discharge instructions gone over with patient. Follow up appointment to be made. Home medications gone over. Prescriptions given, others electronically sent to pharmacy. Educational information on pyelonephritis given. Diet , activity, and signs and symptoms of worsening condition discussed. Patient was encouraged to stop smoking. Patient verbalized understanding of instructions.

## 2016-05-01 NOTE — Discharge Instructions (Signed)
Pyelonephritis, Adult °Pyelonephritis is a kidney infection. The kidneys are organs that help clean your blood by moving waste out of your blood and into your pee (urine). This infection can happen quickly, or it can last for a long time. In most cases, it clears up with treatment and does not cause other problems. °HOME CARE °Medicines °· Take over-the-counter and prescription medicines only as told by your doctor. °· Take your antibiotic medicine as told by your doctor. Do not stop taking the medicine even if you start to feel better. °General Instructions °· Drink enough fluid to keep your pee clear or pale yellow. °· Avoid caffeine, tea, and carbonated drinks. °· Pee (urinate) often. Avoid holding in pee for long periods of time. °· Pee before and after sex. °· After pooping (having a bowel movement), women should wipe from front to back. Use each tissue only once. °· Keep all follow-up visits as told by your doctor. This is important. °GET HELP IF: °· You do not feel better after 2 days. °· Your symptoms get worse. °· You have a fever. °GET HELP RIGHT AWAY IF: °· You cannot take your medicine or drink fluids as told. °· You have chills and shaking. °· You throw up (vomit). °· You have very bad pain in your side (flank) or back. °· You feel very weak or you pass out (faint). °  °This information is not intended to replace advice given to you by your health care provider. Make sure you discuss any questions you have with your health care provider. °  °Document Released: 12/23/2004 Document Revised: 08/06/2015 Document Reviewed: 03/10/2015 °Elsevier Interactive Patient Education ©2016 [employer] ° °

## 2016-05-05 LAB — CULTURE, BLOOD (ROUTINE X 2)
CULTURE: NO GROWTH
CULTURE: NO GROWTH

## 2016-05-21 ENCOUNTER — Emergency Department (HOSPITAL_COMMUNITY)
Admission: EM | Admit: 2016-05-21 | Discharge: 2016-05-22 | Disposition: A | Discharge status: Home / Self Care | Payer: No Typology Code available for payment source | Attending: Emergency Medicine | Admitting: Emergency Medicine

## 2016-05-21 ENCOUNTER — Encounter (HOSPITAL_COMMUNITY): Payer: Self-pay | Admitting: Emergency Medicine

## 2016-05-21 DIAGNOSIS — Y999 Unspecified external cause status: Secondary | ICD-10-CM | POA: Insufficient documentation

## 2016-05-21 DIAGNOSIS — S0081XA Abrasion of other part of head, initial encounter: Secondary | ICD-10-CM

## 2016-05-21 DIAGNOSIS — F17210 Nicotine dependence, cigarettes, uncomplicated: Secondary | ICD-10-CM | POA: Insufficient documentation

## 2016-05-21 DIAGNOSIS — Y939 Activity, unspecified: Secondary | ICD-10-CM | POA: Insufficient documentation

## 2016-05-21 DIAGNOSIS — Y92410 Unspecified street and highway as the place of occurrence of the external cause: Secondary | ICD-10-CM | POA: Insufficient documentation

## 2016-05-21 DIAGNOSIS — S139XXA Sprain of joints and ligaments of unspecified parts of neck, initial encounter: Secondary | ICD-10-CM

## 2016-05-21 DIAGNOSIS — F1290 Cannabis use, unspecified, uncomplicated: Secondary | ICD-10-CM | POA: Insufficient documentation

## 2016-05-21 DIAGNOSIS — S169XXA Unspecified injury of muscle, fascia and tendon at neck level, initial encounter: Secondary | ICD-10-CM | POA: Diagnosis present

## 2016-05-21 DIAGNOSIS — S134XXA Sprain of ligaments of cervical spine, initial encounter: Secondary | ICD-10-CM | POA: Diagnosis not present

## 2016-05-21 MED ORDER — IBUPROFEN 800 MG PO TABS
800.0000 mg | ORAL_TABLET | Freq: Once | ORAL | Status: AC
  Administered 2016-05-22: 800 mg via ORAL
  Filled 2016-05-21: qty 1
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
## 2016-05-22 DIAGNOSIS — S134XXA Sprain of ligaments of cervical spine, initial encounter: Secondary | ICD-10-CM | POA: Diagnosis not present

## 2016-05-22 MED ORDER — METHOCARBAMOL 500 MG PO TABS: 500.0000 mg | ORAL_TABLET | Freq: Three times a day (TID) | ORAL | Status: DC | PRN

## 2016-05-22 MED ORDER — IBUPROFEN 800 MG PO TABS: 800.0000 mg | ORAL_TABLET | Freq: Three times a day (TID) | ORAL | Status: DC | PRN

## 2016-05-22 NOTE — Discharge Instructions (Signed)

## 2016-05-22 NOTE — ED Notes (Signed)
No signature pad in room

## 2016-05-22 NOTE — ED Notes (Signed)
Pt ambulatory to waiting room with family.

## 2016-05-24 ENCOUNTER — Encounter: Payer: Self-pay | Admitting: Obstetrics and Gynecology

## 2016-05-24 ENCOUNTER — Ambulatory Visit (INDEPENDENT_AMBULATORY_CARE_PROVIDER_SITE_OTHER): Payer: BLUE CROSS/BLUE SHIELD | Admitting: Obstetrics and Gynecology

## 2016-05-24 VITALS — BP 118/59 | HR 68 | Temp 98.7°F | Ht 69.0 in | Wt 301.1 lb

## 2016-05-24 DIAGNOSIS — Z124 Encounter for screening for malignant neoplasm of cervix: Secondary | ICD-10-CM | POA: Diagnosis not present

## 2016-05-24 DIAGNOSIS — N979 Female infertility, unspecified: Secondary | ICD-10-CM

## 2016-05-24 LAB — HEMOGLOBIN A1C
Hgb A1c MFr Bld: 5.5 % (ref <5.7)
MEAN PLASMA GLUCOSE: 111 mg/dL

## 2016-05-24 LAB — TSH: TSH: 0.55 m[IU]/L
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
## 2016-05-25 LAB — PROLACTIN: PROLACTIN: 5.8 ng/mL

## 2016-05-25 LAB — CYTOLOGY - PAP

## 2016-05-26 ENCOUNTER — Telehealth: Payer: Self-pay | Admitting: General Practice

## 2016-05-26 NOTE — Telephone Encounter (Signed)
Patient left message requesting medical records to be faxed. Called patient, no answer- left message to call us back at the clinics as we are trying to return your phone call.

## 2016-06-02 NOTE — Telephone Encounter (Signed)
Called patient and informed her of normal labs. Patient verbalized understanding & [facility]. Told patient we will take care of that for her. Patient verbalized understanding & had no questions. Referral to be sent.

## 2016-06-17 ENCOUNTER — Emergency Department (HOSPITAL_COMMUNITY)
Admission: EM | Admit: 2016-06-17 | Discharge: 2016-06-18 | Disposition: A | Discharge status: Home / Self Care | Payer: BLUE CROSS/BLUE SHIELD | Attending: Emergency Medicine | Admitting: Emergency Medicine

## 2016-06-17 ENCOUNTER — Encounter (HOSPITAL_COMMUNITY): Payer: Self-pay | Admitting: Emergency Medicine

## 2016-06-17 DIAGNOSIS — R21 Rash and other nonspecific skin eruption: Secondary | ICD-10-CM | POA: Diagnosis present

## 2016-06-17 DIAGNOSIS — F17210 Nicotine dependence, cigarettes, uncomplicated: Secondary | ICD-10-CM | POA: Insufficient documentation

## 2016-06-17 NOTE — ED Notes (Signed)
Pt. reports generalized itchy skin rashes for 1 week , denies SOB , no oral swelling / airway intact .

## 2016-06-18 MED ORDER — HYDROCORTISONE 1 % EX CREA: TOPICAL_CREAM | CUTANEOUS | Status: DC

## 2016-06-18 MED ORDER — HYDROXYZINE HCL 25 MG PO TABS: 25.0000 mg | ORAL_TABLET | Freq: Four times a day (QID) | ORAL | Status: DC
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
## 2016-06-18 NOTE — Discharge Instructions (Signed)
Please follow-up with your dermatologist for further evaluation and management.

## 2016-06-18 NOTE — ED Notes (Signed)
Pt departed in NAD.  

## 2017-02-28 ENCOUNTER — Ambulatory Visit: Payer: BLUE CROSS/BLUE SHIELD | Admitting: Obstetrics & Gynecology

## 2017-03-24 ENCOUNTER — Ambulatory Visit: Payer: BLUE CROSS/BLUE SHIELD | Admitting: Obstetrics & Gynecology

## 2017-04-30 ENCOUNTER — Encounter (HOSPITAL_COMMUNITY): Payer: Self-pay

## 2017-04-30 ENCOUNTER — Inpatient Hospital Stay (HOSPITAL_COMMUNITY): Payer: Medicaid Other

## 2017-04-30 ENCOUNTER — Inpatient Hospital Stay (HOSPITAL_COMMUNITY)
Admission: AD | Admit: 2017-04-30 | Discharge: 2017-04-30 | Disposition: A | Discharge status: Home / Self Care | Payer: Medicaid Other | Source: Ambulatory Visit | Attending: Obstetrics & Gynecology | Admitting: Obstetrics & Gynecology

## 2017-04-30 DIAGNOSIS — Z9889 Other specified postprocedural states: Secondary | ICD-10-CM | POA: Diagnosis not present

## 2017-04-30 DIAGNOSIS — Z833 Family history of diabetes mellitus: Secondary | ICD-10-CM | POA: Insufficient documentation

## 2017-04-30 DIAGNOSIS — Z3A01 Less than 8 weeks gestation of pregnancy: Secondary | ICD-10-CM | POA: Insufficient documentation

## 2017-04-30 DIAGNOSIS — D573 Sickle-cell trait: Secondary | ICD-10-CM | POA: Insufficient documentation

## 2017-04-30 DIAGNOSIS — O99011 Anemia complicating pregnancy, first trimester: Secondary | ICD-10-CM | POA: Insufficient documentation

## 2017-04-30 DIAGNOSIS — O3481 Maternal care for other abnormalities of pelvic organs, first trimester: Secondary | ICD-10-CM | POA: Diagnosis not present

## 2017-04-30 DIAGNOSIS — Z823 Family history of stroke: Secondary | ICD-10-CM | POA: Insufficient documentation

## 2017-04-30 DIAGNOSIS — R531 Weakness: Secondary | ICD-10-CM | POA: Diagnosis present

## 2017-04-30 DIAGNOSIS — G43909 Migraine, unspecified, not intractable, without status migrainosus: Secondary | ICD-10-CM | POA: Insufficient documentation

## 2017-04-30 DIAGNOSIS — Z3201 Encounter for pregnancy test, result positive: Secondary | ICD-10-CM | POA: Diagnosis not present

## 2017-04-30 DIAGNOSIS — O99351 Diseases of the nervous system complicating pregnancy, first trimester: Secondary | ICD-10-CM | POA: Diagnosis not present

## 2017-04-30 DIAGNOSIS — O26891 Other specified pregnancy related conditions, first trimester: Secondary | ICD-10-CM | POA: Diagnosis not present

## 2017-04-30 DIAGNOSIS — R109 Unspecified abdominal pain: Secondary | ICD-10-CM | POA: Diagnosis not present

## 2017-04-30 DIAGNOSIS — G5600 Carpal tunnel syndrome, unspecified upper limb: Secondary | ICD-10-CM | POA: Diagnosis not present

## 2017-04-30 DIAGNOSIS — D571 Sickle-cell disease without crisis: Secondary | ICD-10-CM | POA: Insufficient documentation

## 2017-04-30 DIAGNOSIS — N83202 Unspecified ovarian cyst, left side: Secondary | ICD-10-CM | POA: Insufficient documentation

## 2017-04-30 LAB — URINALYSIS, ROUTINE W REFLEX MICROSCOPIC
Bilirubin Urine: NEGATIVE
GLUCOSE, UA: NEGATIVE mg/dL
Hgb urine dipstick: NEGATIVE
Ketones, ur: NEGATIVE mg/dL
LEUKOCYTES UA: NEGATIVE
Nitrite: NEGATIVE
PH: 5 (ref 5.0–8.0)
Protein, ur: NEGATIVE mg/dL
Specific Gravity, Urine: 1.009 (ref 1.005–1.030)

## 2017-04-30 LAB — CBC WITH DIFFERENTIAL/PLATELET
BASOS PCT: 0 %
Basophils Absolute: 0 10*3/uL (ref 0.0–0.1)
Eosinophils Absolute: 0.1 10*3/uL (ref 0.0–0.7)
Eosinophils Relative: 1 %
HEMATOCRIT: 37.8 % (ref 36.0–46.0)
HEMOGLOBIN: 13.2 g/dL (ref 12.0–15.0)
LYMPHS ABS: 3.1 10*3/uL (ref 0.7–4.0)
LYMPHS PCT: 30 %
MCH: 29.4 pg (ref 26.0–34.0)
MCHC: 34.9 g/dL (ref 30.0–36.0)
MCV: 84.2 fL (ref 78.0–100.0)
MONOS PCT: 4 %
Monocytes Absolute: 0.5 10*3/uL (ref 0.1–1.0)
NEUTROS ABS: 6.7 10*3/uL (ref 1.7–7.7)
Neutrophils Relative %: 65 %
Platelets: 220 10*3/uL (ref 150–400)
RBC: 4.49 MIL/uL (ref 3.87–5.11)
RDW: 14.2 % (ref 11.5–15.5)
WBC: 10.3 10*3/uL (ref 4.0–10.5)

## 2017-04-30 LAB — GLUCOSE, CAPILLARY: Glucose-Capillary: 90 mg/dL (ref 65–99)

## 2017-04-30 LAB — POCT PREGNANCY, URINE: Preg Test, Ur: POSITIVE — AB

## 2017-04-30 LAB — HCG, QUANTITATIVE, PREGNANCY: HCG, BETA CHAIN, QUANT, S: 1383 m[IU]/mL — AB (ref <5)
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
## 2017-04-30 NOTE — Discharge Instructions (Signed)
First Trimester of Pregnancy The first trimester of pregnancy is from week 1 until the end of week 13 (months 1 through 3). A week after a sperm fertilizes an egg, the egg will implant on the wall of the uterus. This embryo will begin to develop into a baby. Genes from you and your partner will form the baby. The female genes will determine whether the baby will be a boy or a girl. At 6-8 weeks, the eyes and face will be formed, and the heartbeat can be seen on ultrasound. At the end of 12 weeks, all the baby's organs will be formed. Now that you are pregnant, you will want to do everything you can to have a healthy baby. Two of the most important things are to get good prenatal care and to follow your health care provider's instructions. Prenatal care is all the medical care you receive before the baby's birth. This care will help prevent, find, and treat any problems during the pregnancy and childbirth. Body changes during your first trimester Your body goes through many changes during pregnancy. The changes vary from woman to woman.  You may gain or lose a couple of pounds at first.  You may feel sick to your stomach (nauseous) and you may throw up (vomit). If the vomiting is uncontrollable, call your health care provider.  You may tire easily.  You may develop headaches that can be relieved by medicines. All medicines should be approved by your health care provider.  You may urinate more often. Painful urination may mean you have a bladder infection.  You may develop heartburn as a result of your pregnancy.  You may develop constipation because certain hormones are causing the muscles that push stool through your intestines to slow down.  You may develop hemorrhoids or swollen veins (varicose veins).  Your breasts may begin to grow larger and become tender. Your nipples may stick out more, and the tissue that surrounds them (areola) may become darker.  Your gums may bleed and may be  sensitive to brushing and flossing.  Dark spots or blotches (chloasma, mask of pregnancy) may develop on your face. This will likely fade after the baby is born.  Your menstrual periods will stop.  You may have a loss of appetite.  You may develop cravings for certain kinds of food.  You may have changes in your emotions from day to day, such as being excited to be pregnant or being concerned that something may go wrong with the pregnancy and baby.  You may have more vivid and strange dreams.  You may have changes in your hair. These can include thickening of your hair, rapid growth, and changes in texture. Some women also have hair loss during or after pregnancy, or hair that feels dry or thin. Your hair will most likely return to normal after your baby is born.  What to expect at prenatal visits During a routine prenatal visit:  You will be weighed to make sure you and the baby are growing normally.  Your blood pressure will be taken.  Your abdomen will be measured to track your baby's growth.  The fetal heartbeat will be listened to between weeks 10 and 14 of your pregnancy.  Test results from any previous visits will be discussed.  Your health care provider may ask you:  How you are feeling.  If you are feeling the baby move.  If you have had any abnormal symptoms, such as leaking fluid, bleeding, severe headaches,   or abdominal cramping.  If you are using any tobacco products, including cigarettes, chewing tobacco, and electronic cigarettes.  If you have any questions.  Other tests that may be performed during your first trimester include:  Blood tests to find your blood type and to check for the presence of any previous infections. The tests will also be used to check for low iron levels (anemia) and protein on red blood cells (Rh antibodies). Depending on your risk factors, or if you previously had diabetes during pregnancy, you may have tests to check for high blood  sugar that affects pregnant women (gestational diabetes).  Urine tests to check for infections, diabetes, or protein in the urine.  An ultrasound to confirm the proper growth and development of the baby.  Fetal screens for spinal cord problems (spina bifida) and Down syndrome.  HIV (human immunodeficiency virus) testing. Routine prenatal testing includes screening for HIV, unless you choose not to have this test.  You may need other tests to make sure you and the baby are doing well.  Follow these instructions at home: Medicines  Follow your health care provider's instructions regarding medicine use. Specific medicines may be either safe or unsafe to take during pregnancy.  Take a prenatal vitamin that contains at least 600 micrograms (mcg) of folic acid.  If you develop constipation, try taking a stool softener if your health care provider approves. Eating and drinking  Eat a balanced diet that includes fresh fruits and vegetables, whole grains, good sources of protein such as meat, eggs, or tofu, and low-fat dairy. Your health care provider will help you determine the amount of weight gain that is right for you.  Avoid raw meat and uncooked cheese. These carry germs that can cause birth defects in the baby.  Eating four or five small meals rather than three large meals a day may help relieve nausea and vomiting. If you start to feel nauseous, eating a few soda crackers can be helpful. Drinking liquids between meals, instead of during meals, also seems to help ease nausea and vomiting.  Limit foods that are high in fat and processed sugars, such as fried and sweet foods.  To prevent constipation: ? Eat foods that are high in fiber, such as fresh fruits and vegetables, whole grains, and beans. ? Drink enough fluid to keep your urine clear or pale yellow. Activity  Exercise only as directed by your health care provider. Most women can continue their usual exercise routine during  pregnancy. Try to exercise for 30 minutes at least 5 days a week. Exercising will help you: ? Control your weight. ? Stay in shape. ? Be prepared for labor and delivery.  Experiencing pain or cramping in the lower abdomen or lower back is a good sign that you should stop exercising. Check with your health care provider before continuing with normal exercises.  Try to avoid standing for long periods of time. Move your legs often if you must stand in one place for a long time.  Avoid heavy lifting.  Wear low-heeled shoes and practice good posture.  You may continue to have sex unless your health care provider tells you not to. Relieving pain and discomfort  Wear a good support bra to relieve breast tenderness.  Take warm sitz baths to soothe any pain or discomfort caused by hemorrhoids. Use hemorrhoid cream if your health care provider approves.  Rest with your legs elevated if you have leg cramps or low back pain.  If you develop   varicose veins in your legs, wear support hose. Elevate your feet for 15 minutes, 3-4 times a day. Limit salt in your diet. Prenatal care  Schedule your prenatal visits by the twelfth week of pregnancy. They are usually scheduled monthly at first, then more often in the last 2 months before delivery.  Write down your questions. Take them to your prenatal visits.  Keep all your prenatal visits as told by your health care provider. This is important. Safety  Wear your seat belt at all times when driving.  Make a list of emergency phone numbers, including numbers for family, friends, the hospital, and police and fire departments. General instructions  Ask your health care provider for a referral to a local prenatal education class. Begin classes no later than the beginning of month 6 of your pregnancy.  Ask for help if you have counseling or nutritional needs during pregnancy. Your health care provider can offer advice or refer you to specialists for help  with various needs.  Do not use hot tubs, steam rooms, or saunas.  Do not douche or use tampons or scented sanitary pads.  Do not cross your legs for long periods of time.  Avoid cat litter boxes and soil used by cats. These carry germs that can cause birth defects in the baby and possibly loss of the fetus by miscarriage or stillbirth.  Avoid all smoking, herbs, alcohol, and medicines not prescribed by your health care provider. Chemicals in these products affect the formation and growth of the baby.  Do not use any products that contain nicotine or tobacco, such as cigarettes and e-cigarettes. If you need help quitting, ask your health care provider. You may receive counseling support and other resources to help you quit.  Schedule a dentist appointment. At home, brush your teeth with a soft toothbrush and be gentle when you floss. Contact a health care provider if:  You have dizziness.  You have mild pelvic cramps, pelvic pressure, or nagging pain in the abdominal area.  You have persistent nausea, vomiting, or diarrhea.  You have a bad smelling vaginal discharge.  You have pain when you urinate.  You notice increased swelling in your face, hands, legs, or ankles.  You are exposed to fifth disease or chickenpox.  You are exposed to German measles (rubella) and have never had it. Get help right away if:  You have a fever.  You are leaking fluid from your vagina.  You have spotting or bleeding from your vagina.  You have severe abdominal cramping or pain.  You have rapid weight gain or loss.  You vomit blood or material that looks like coffee grounds.  You develop a severe headache.  You have shortness of breath.  You have any kind of trauma, such as from a fall or a car accident. Summary  The first trimester of pregnancy is from week 1 until the end of week 13 (months 1 through 3).  Your body goes through many changes during pregnancy. The changes vary from  woman to woman.  You will have routine prenatal visits. During those visits, your health care provider will examine you, discuss any test results you may have, and talk with you about how you are feeling. This information is not intended to replace advice given to you by your health care provider. Make sure you discuss any questions you have with your health care provider. Document Released: 11/09/2001 Document Revised: 10/27/2016 Document Reviewed: 10/27/2016 Elsevier Interactive Patient Education  2017 [employer]  

## 2017-04-30 NOTE — MAU Note (Signed)
Patient presents with irregular periods, mild stomach pain feels gassy, having regular bowel movements. Bleed last in April unsure of date.

## 2017-05-05 ENCOUNTER — Ambulatory Visit: Payer: BLUE CROSS/BLUE SHIELD | Admitting: Obstetrics & Gynecology

## 2017-05-16 DIAGNOSIS — Z6791 Unspecified blood type, Rh negative: Secondary | ICD-10-CM | POA: Insufficient documentation

## 2017-05-16 DIAGNOSIS — D573 Sickle-cell trait: Secondary | ICD-10-CM | POA: Insufficient documentation

## 2017-06-08 ENCOUNTER — Encounter: Payer: Self-pay | Admitting: Family Medicine

## 2017-06-08 ENCOUNTER — Encounter: Payer: Medicaid Other | Admitting: Advanced Practice Midwife

## 2017-06-29 ENCOUNTER — Inpatient Hospital Stay (HOSPITAL_COMMUNITY)
Admission: AD | Admit: 2017-06-29 | Payer: Medicaid Other | Source: Ambulatory Visit | Admitting: Obstetrics and Gynecology

## 2017-08-03 IMAGING — US US ABDOMEN COMPLETE
1 series · 14 of 25 positions shown · non-contrast
Comparison: CT 05/17/2009

CLINICAL DATA: Right-sided abdominal pain, onset yesterday.

EXAM:
ABDOMEN ULTRASOUND COMPLETE

[Series 1: us abdomen complete · 0.20mm/px · 14 of 85 slices shown]
[im 1/85]
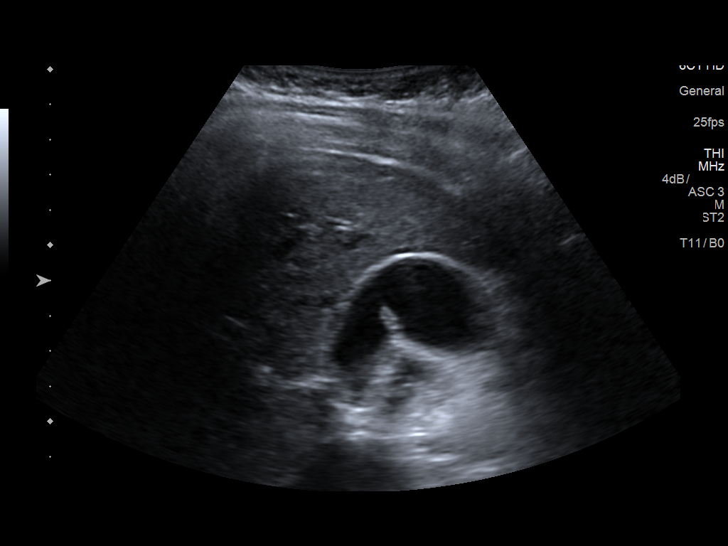
[im 8/85]
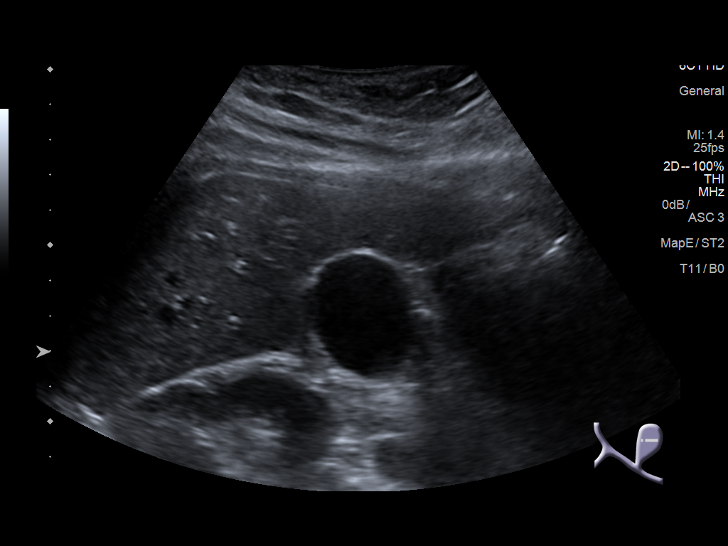
[im 15/85]
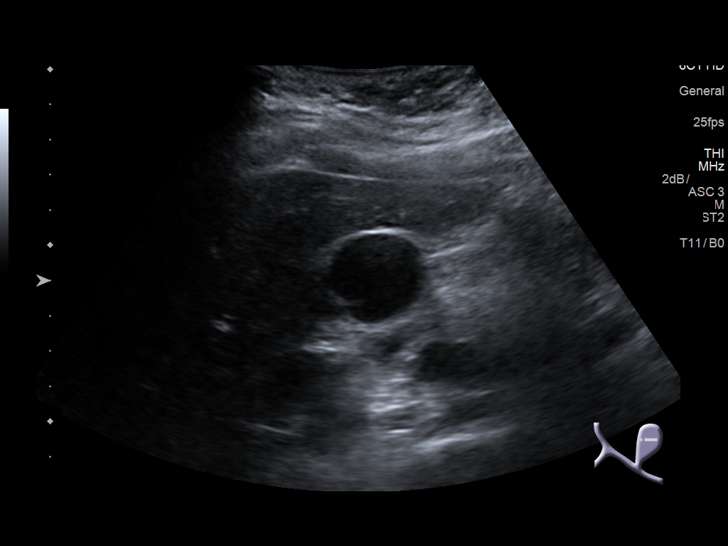
[im 22/85]
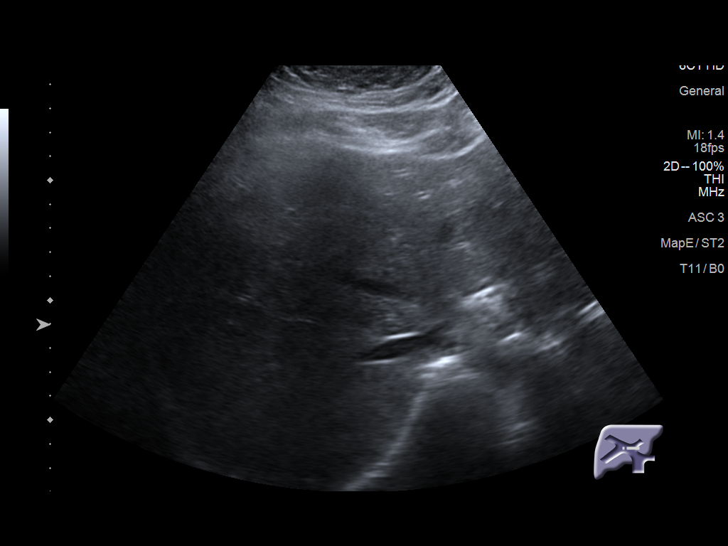
[im 29/85]
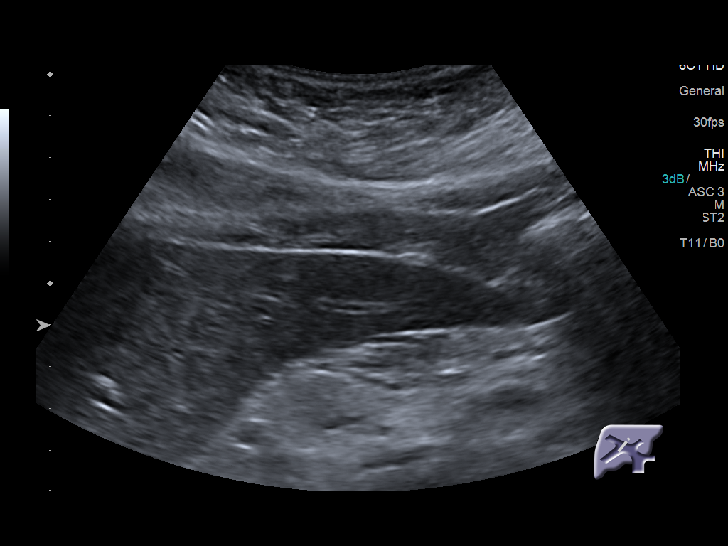
[im 32/85]
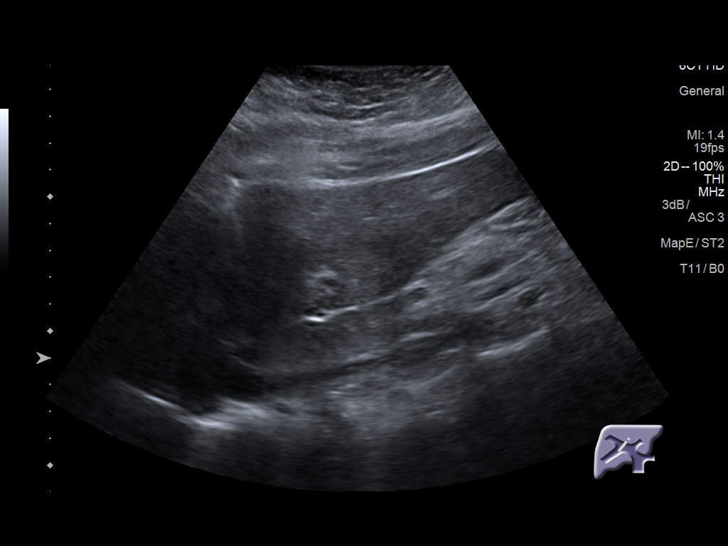
[im 39/85]
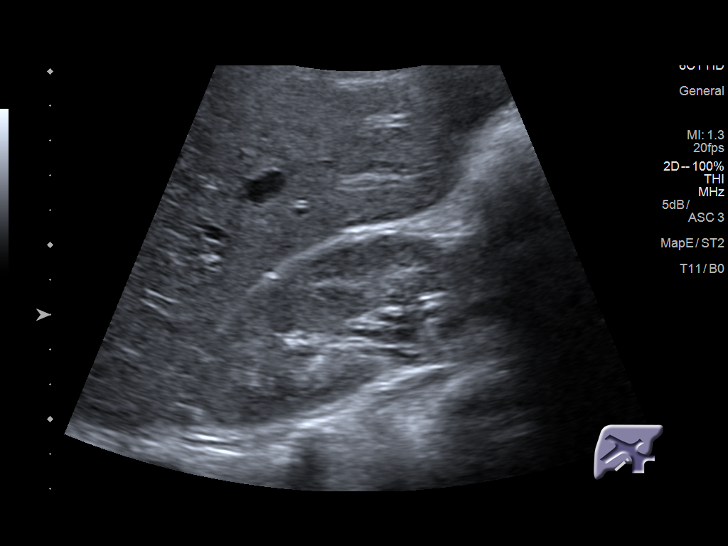
[im 46/85]
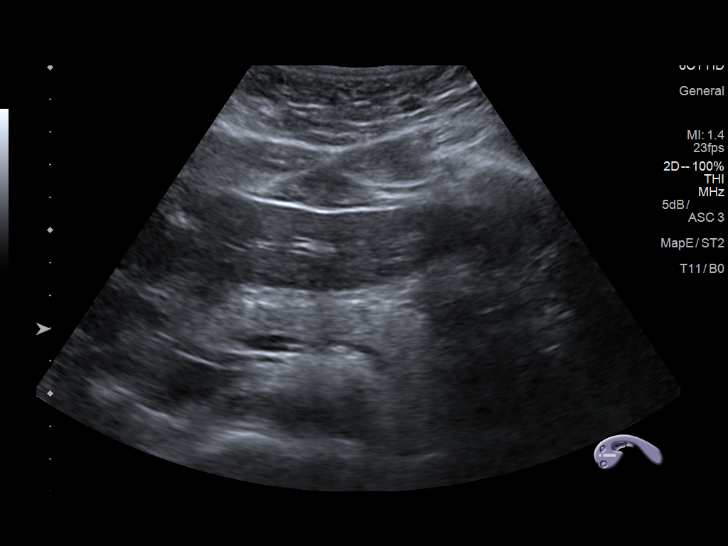
[im 53/85]
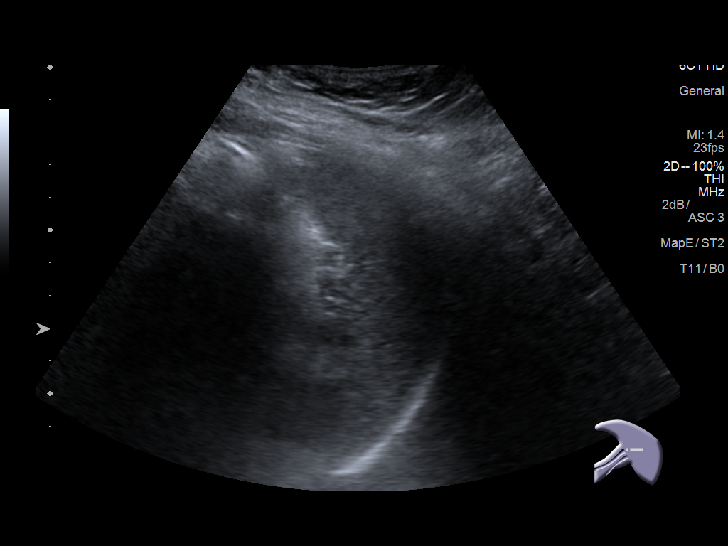
[im 57/85]
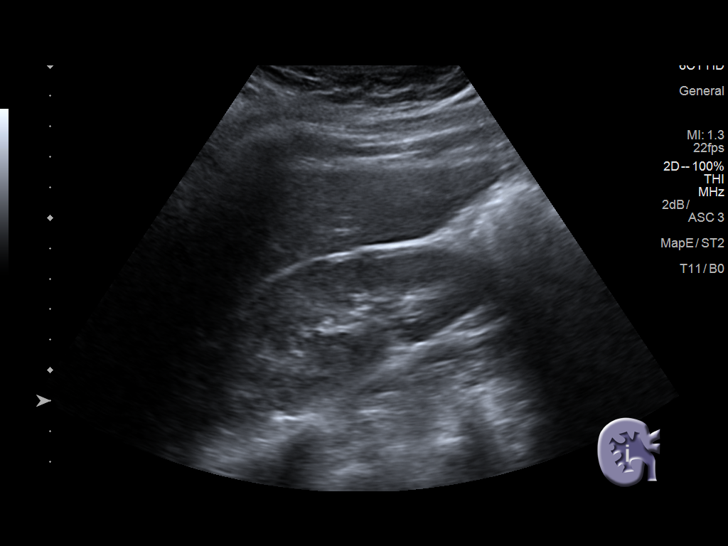
[im 64/85]
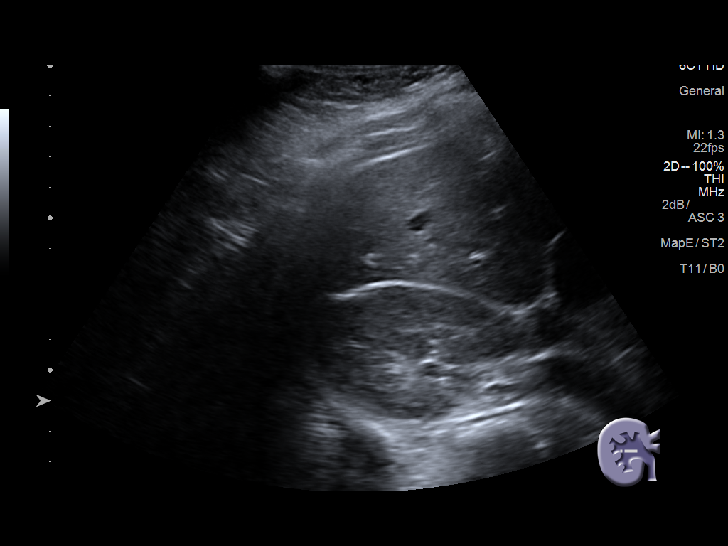
[im 71/85]
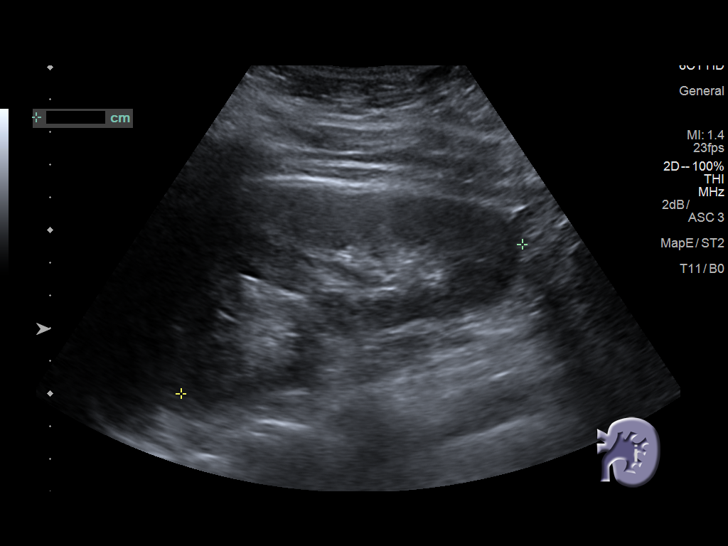
[im 78/85]
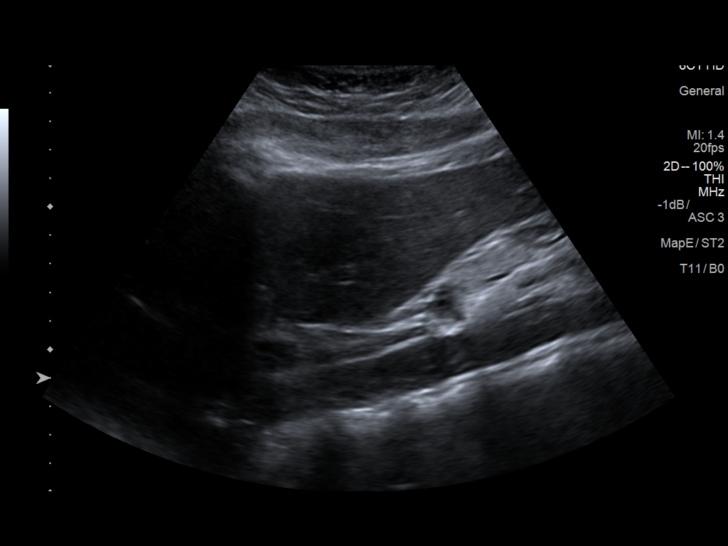
[im 85/85]
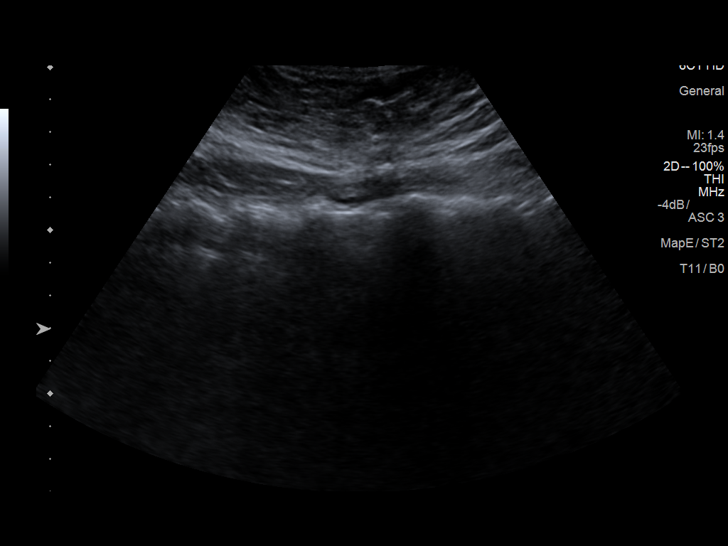

[14 of 25 positions shown; findings below may reference images not displayed]

FINDINGS: Gallbladder: Physiologically distended. No gallstones or wall
thickening visualized. No sonographic Murphy sign noted by
sonographer.

Common bile duct: Diameter: 2 mm, normal.

Liver: No focal lesion identified. Borderline increased in
parenchymal echogenicity. Normal directional flow in the main portal
vein.

IVC: No abnormality visualized.

Pancreas: Visualized portion unremarkable.

Spleen: Size and appearance within normal limits.

Right Kidney: Length: 12.1 cm. Echogenicity within normal limits. No
mass or hydronephrosis visualized. No shadowing nephrolithiasis.

Left Kidney: Length: 11.4 cm. Echogenicity within normal limits. No
mass or hydronephrosis visualized. No shadowing nephrolithiasis.

Abdominal aorta: No aneurysm visualized.  Distal portion obscured.

Other findings: None.  No ascites.
IMPRESSION: 1. Normal sonographic appearance of the gallbladder. No biliary
dilatation.
2. Borderline hepatic steatosis.
3. Normal sonographic appearance of the right kidney.

## 2017-10-14 ENCOUNTER — Emergency Department (HOSPITAL_COMMUNITY)
Admission: EM | Admit: 2017-10-14 | Discharge: 2017-10-14 | Disposition: A | Discharge status: Home / Self Care | Payer: Managed Care, Other (non HMO) | Attending: Emergency Medicine | Admitting: Emergency Medicine

## 2017-10-14 ENCOUNTER — Other Ambulatory Visit: Payer: Self-pay

## 2017-10-14 ENCOUNTER — Encounter (HOSPITAL_COMMUNITY): Payer: Self-pay | Admitting: *Deleted

## 2017-10-14 ENCOUNTER — Emergency Department (HOSPITAL_COMMUNITY): Payer: Managed Care, Other (non HMO)

## 2017-10-14 DIAGNOSIS — R1010 Upper abdominal pain, unspecified: Secondary | ICD-10-CM | POA: Insufficient documentation

## 2017-10-14 DIAGNOSIS — R112 Nausea with vomiting, unspecified: Secondary | ICD-10-CM

## 2017-10-14 DIAGNOSIS — Z79899 Other long term (current) drug therapy: Secondary | ICD-10-CM | POA: Insufficient documentation

## 2017-10-14 LAB — URINALYSIS, ROUTINE W REFLEX MICROSCOPIC
BILIRUBIN URINE: NEGATIVE
Bacteria, UA: NONE SEEN
GLUCOSE, UA: NEGATIVE mg/dL
KETONES UR: NEGATIVE mg/dL
LEUKOCYTES UA: NEGATIVE
Nitrite: NEGATIVE
Protein, ur: NEGATIVE mg/dL
Specific Gravity, Urine: 1.013 (ref 1.005–1.030)
pH: 7 (ref 5.0–8.0)

## 2017-10-14 LAB — COMPREHENSIVE METABOLIC PANEL
ALK PHOS: 33 U/L — AB (ref 38–126)
ALT: 10 U/L — AB (ref 14–54)
AST: 21 U/L (ref 15–41)
Albumin: 3.8 g/dL (ref 3.5–5.0)
Anion gap: 5 (ref 5–15)
BUN: 7 mg/dL (ref 6–20)
CALCIUM: 9.3 mg/dL (ref 8.9–10.3)
CO2: 25 mmol/L (ref 22–32)
CREATININE: 0.75 mg/dL (ref 0.44–1.00)
Chloride: 108 mmol/L (ref 101–111)
Glucose, Bld: 98 mg/dL (ref 65–99)
Potassium: 3.9 mmol/L (ref 3.5–5.1)
Sodium: 138 mmol/L (ref 135–145)
TOTAL PROTEIN: 7 g/dL (ref 6.5–8.1)
Total Bilirubin: 0.4 mg/dL (ref 0.3–1.2)

## 2017-10-14 LAB — CBC
HCT: 39.9 % (ref 36.0–46.0)
Hemoglobin: 13.8 g/dL (ref 12.0–15.0)
MCH: 29.7 pg (ref 26.0–34.0)
MCHC: 34.6 g/dL (ref 30.0–36.0)
MCV: 85.8 fL (ref 78.0–100.0)
PLATELETS: 213 10*3/uL (ref 150–400)
RBC: 4.65 MIL/uL (ref 3.87–5.11)
RDW: 13.8 % (ref 11.5–15.5)
WBC: 9.9 10*3/uL (ref 4.0–10.5)

## 2017-10-14 LAB — POC URINE PREG, ED: Preg Test, Ur: NEGATIVE

## 2017-10-14 LAB — LIPASE, BLOOD: Lipase: 23 U/L (ref 11–51)

## 2017-10-14 MED ORDER — ONDANSETRON HCL 4 MG/2ML IJ SOLN
4.0000 mg | Freq: Once | INTRAMUSCULAR | Status: AC
  Administered 2017-10-14: 4 mg via INTRAVENOUS
  Filled 2017-10-14: qty 2

## 2017-10-14 MED ORDER — DIPHENHYDRAMINE HCL 50 MG/ML IJ SOLN
12.5000 mg | Freq: Once | INTRAMUSCULAR | Status: AC
  Administered 2017-10-14: 12.5 mg via INTRAVENOUS
  Filled 2017-10-14: qty 1

## 2017-10-14 MED ORDER — DICYCLOMINE HCL 20 MG PO TABS: 20.0000 mg | ORAL_TABLET | Freq: Two times a day (BID) | ORAL | 0 refills | Status: DC

## 2017-10-14 MED ORDER — METOCLOPRAMIDE HCL 5 MG/ML IJ SOLN
10.0000 mg | Freq: Once | INTRAMUSCULAR | Status: AC
  Administered 2017-10-14: 10 mg via INTRAVENOUS
  Filled 2017-10-14: qty 2

## 2017-10-14 MED ORDER — ACETAMINOPHEN 325 MG PO TABS
650.0000 mg | ORAL_TABLET | Freq: Once | ORAL | Status: AC
  Administered 2017-10-14: 650 mg via ORAL
  Filled 2017-10-14: qty 2

## 2017-10-14 MED ORDER — ONDANSETRON 4 MG PO TBDP
4.0000 mg | ORAL_TABLET | Freq: Once | ORAL | Status: DC
  Filled 2017-10-14: qty 1

## 2017-10-14 MED ORDER — PROMETHAZINE HCL 25 MG PO TABS: 25.0000 mg | ORAL_TABLET | Freq: Four times a day (QID) | ORAL | 0 refills | Status: DC | PRN

## 2017-10-14 MED ORDER — IOPAMIDOL (ISOVUE-300) INJECTION 61%
INTRAVENOUS | Status: AC
  Administered 2017-10-14: 100 mL
  Filled 2017-10-14: qty 100

## 2017-10-14 MED ORDER — SODIUM CHLORIDE 0.9 % IV BOLUS (SEPSIS)
1000.0000 mL | Freq: Once | INTRAVENOUS | Status: AC
  Administered 2017-10-14: 1000 mL via INTRAVENOUS

## 2017-10-14 NOTE — ED Provider Notes (Signed)
Pt signed out to me at shift change. Pt in ED with nausea, vomiting, abdominal pain for 24 hrs. Pt was given IV fluids, zofran, reglan. Still having pain. Not pregnant. CT abd/pelvis ordered an pending  4:34 PM CT negative for acute pathology. Showing liver lesions and pancreas cyst as described above. Will need outpatient work up with MRI.  I reassessed pt. She is feeling much better. She states nausea improved. She would like to try to drink and eat. Will give PO challenge.   4:54 PM Able to keep down fluids and crackers.  She is comfortable going home.  Abdomen is soft.  VS normal. Home with Bentyl and Phenergan.  Follow-up with family doctor regarding her CT findings.  Return precautions discussed.  Vitals:   10/14/17 1230 10/14/17 1245 10/14/17 1330 10/14/17 1415  BP:  114/73 108/70 110/66  Pulse: (!) 47 (!) 59 (!) 50 61  Resp:      Temp:      TempSrc:      SpO2: 100% 100% 100% 100%  Weight:      Height:          Jaynie CrumbleKirichenko, Kerin Kren, PA-C 10/14/17 1701    Gwyneth SproutPlunkett, Whitney, MD 10/14/17 2018
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
## 2017-10-14 NOTE — ED Notes (Signed)
Patient transported to CT 

## 2017-10-14 NOTE — ED Triage Notes (Signed)
Pt in c/o abd pain intermittent mid upper abd & dizziness, pt c/o n/v, pt reports x3 vomiting episodes in the last 24 hrs, Pt c/o cold like symptoms onset yesterday, denies diarrhea, A&o X4, DENIES sob

## 2017-10-14 NOTE — Discharge Instructions (Signed)
Take bentyl for abdominal cramping as prescribed as needed. Phenergan for nausea and vomiting. Your CT showed some cysts on your liver and one on pancreas. Please try to find a primary care doctor and follow up for further imaging of these lesions. Otherwise rest, drink plenty of fluids. Follow up as needed, return if worsening.

## 2017-11-29 NOTE — L&D Delivery Note (Signed)
Delivery Note At 1:09 PM a viable female was delivered via Vaginal, Spontaneous (Presentation: ;  ).  APGAR: 9, 9; weight  .   Placenta status: , .  Cord:  with the following complications: .  Cord pH: na  Anesthesia:   Episiotomy: None Lacerations: None Suture Repair: na Est. Blood Loss (mL): 50  Mom to postpartum.  Baby to Couplet care / Skin to Skin.  Anne Ray A 08/28/2018, 2:02 PM
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
## 2017-12-05 ENCOUNTER — Ambulatory Visit (INDEPENDENT_AMBULATORY_CARE_PROVIDER_SITE_OTHER): Payer: 59 | Admitting: Adult Health

## 2017-12-05 ENCOUNTER — Encounter: Payer: Self-pay | Admitting: Adult Health

## 2017-12-05 VITALS — BP 105/70 | HR 73 | Ht 69.0 in | Wt 283.7 lb

## 2017-12-05 DIAGNOSIS — Z0000 Encounter for general adult medical examination without abnormal findings: Secondary | ICD-10-CM

## 2017-12-05 DIAGNOSIS — Z833 Family history of diabetes mellitus: Secondary | ICD-10-CM

## 2017-12-05 DIAGNOSIS — K7689 Other specified diseases of liver: Secondary | ICD-10-CM | POA: Diagnosis not present

## 2017-12-05 DIAGNOSIS — D573 Sickle-cell trait: Secondary | ICD-10-CM | POA: Diagnosis not present

## 2017-12-05 DIAGNOSIS — R5383 Other fatigue: Secondary | ICD-10-CM

## 2017-12-05 DIAGNOSIS — G43909 Migraine, unspecified, not intractable, without status migrainosus: Secondary | ICD-10-CM | POA: Insufficient documentation

## 2017-12-05 MED ORDER — TOPIRAMATE 25 MG PO CPSP: ORAL_CAPSULE | ORAL | 0 refills | Status: DC

## 2017-12-05 NOTE — Assessment & Plan Note (Signed)
Will check A1c at CPE next month

## 2017-12-05 NOTE — Assessment & Plan Note (Signed)
10/14/17: CT Pelvis/Abdomen - IMPRESSION: Three areas of increased attenuation identified within the liver as described. These do not have the characteristic appearance of hemangiomas and may represent areas of focal nodular hyperplasia or hepatic adenomas. Further evaluation by means of outpatient MRI with and without Eovist contrast material is recommended.  14 mm cystic lesion in the tail of the pancreas. This can also be evaluated during the MRI evaluation of hepatic lesions.  No acute abnormality is identified  Will further discuss this as CPE, poss referral to GI

## 2017-12-05 NOTE — Assessment & Plan Note (Signed)
Continue excellent water intake and continue to exercise regularly. Please start Topamax 25mg - 1 tablet nightly for first week, then increase to 1 tablet twice daily. Continue to avoid tobacco/alcohol. Please schedule an appt in 4 weeks- fasting labs, complete physical, and to assess how your tolerating Topamax.

## 2017-12-05 NOTE — Patient Instructions (Addendum)
Heart-Healthy Eating Plan Many factors influence your heart health, including eating and exercise habits. Heart (coronary) risk increases with abnormal blood fat (lipid) levels. Heart-healthy meal planning includes limiting unhealthy fats, increasing healthy fats, and making other small dietary changes. This includes maintaining a healthy body weight to help keep lipid levels within a normal range. What is my plan? Your health care provider recommends that you:  Get no more than ___25____% of the total calories in your daily diet from fat.  Limit your intake of saturated fat to less than ___5___% of your total calories each day.  Limit the amount of cholesterol in your diet to less than __300___ mg per day.  What types of fat should I choose?  Choose healthy fats more often. Choose monounsaturated and polyunsaturated fats, such as olive oil and canola oil, flaxseeds, walnuts, almonds, and seeds.  Eat more omega-3 fats. Good choices include salmon, mackerel, sardines, tuna, flaxseed oil, and ground flaxseeds. Aim to eat fish at least two times each week.  Limit saturated fats. Saturated fats are primarily found in animal products, such as meats, butter, and cream. Plant sources of saturated fats include palm oil, palm kernel oil, and coconut oil.  Avoid foods with partially hydrogenated oils in them. These contain trans fats. Examples of foods that contain trans fats are stick margarine, some tub margarines, cookies, crackers, and other baked goods. What general guidelines do I need to follow?  Check food labels carefully to identify foods with trans fats or high amounts of saturated fat.  Fill one half of your plate with vegetables and green salads. Eat 4-5 servings of vegetables per day. A serving of vegetables equals 1 cup of raw leafy vegetables,  cup of raw or cooked cut-up vegetables, or  cup of vegetable juice.  Fill one fourth of your plate with whole grains. Look for the word  "whole" as the first word in the ingredient list.  Fill one fourth of your plate with lean protein foods.  Eat 4-5 servings of fruit per day. A serving of fruit equals one medium whole fruit,  cup of dried fruit,  cup of fresh, frozen, or canned fruit, or  cup of 100% fruit juice.  Eat more foods that contain soluble fiber. Examples of foods that contain this type of fiber are apples, broccoli, carrots, beans, peas, and barley. Aim to get 20-30 g of fiber per day.  Eat more home-cooked food and less restaurant, buffet, and fast food.  Limit or avoid alcohol.  Limit foods that are high in starch and sugar.  Avoid fried foods.  Cook foods by using methods other than frying. Baking, boiling, grilling, and broiling are all great options. Other fat-reducing suggestions include: ? Removing the skin from poultry. ? Removing all visible fats from meats. ? Skimming the fat off of stews, soups, and gravies before serving them. ? Steaming vegetables in water or broth.  Lose weight if you are overweight. Losing just 5-10% of your initial body weight can help your overall health and prevent diseases such as diabetes and heart disease.  Increase your consumption of nuts, legumes, and seeds to 4-5 servings per week. One serving of dried beans or legumes equals  cup after being cooked, one serving of nuts equals 1 ounces, and one serving of seeds equals  ounce or 1 tablespoon.  You may need to monitor your salt (sodium) intake, especially if you have high blood pressure. Talk with your health care provider or dietitian to get  more information about reducing sodium. What foods can I eat? Grains  Breads, including French, white, pita, wheat, raisin, rye, oatmeal, and Italian. Tortillas that are neither fried nor made with lard or trans fat. Low-fat rolls, including hotdog and hamburger buns and English muffins. Biscuits. Muffins. Waffles. Pancakes. Light popcorn. Whole-grain cereals. Flatbread.  Melba toast. Pretzels. Breadsticks. Rusks. Low-fat snacks and crackers, including oyster, saltine, matzo, graham, animal, and rye. Rice and pasta, including brown rice and those that are made with whole wheat. Vegetables All vegetables. Fruits All fruits, but limit coconut. Meats and Other Protein Sources Lean, well-trimmed beef, veal, pork, and lamb. Chicken and turkey without skin. All fish and shellfish. Wild duck, rabbit, pheasant, and venison. Egg whites or low-cholesterol egg substitutes. Dried beans, peas, lentils, and tofu.Seeds and most nuts. Dairy Low-fat or nonfat cheeses, including ricotta, string, and mozzarella. Skim or 1% milk that is liquid, powdered, or evaporated. Buttermilk that is made with low-fat milk. Nonfat or low-fat yogurt. Beverages Mineral water. Diet carbonated beverages. Sweets and Desserts Sherbets and fruit ices. Honey, jam, marmalade, jelly, and syrups. Meringues and gelatins. Pure sugar candy, such as hard candy, jelly beans, gumdrops, mints, marshmallows, and small amounts of dark chocolate. Angel food cake. Eat all sweets and desserts in moderation. Fats and Oils Nonhydrogenated (trans-free) margarines. Vegetable oils, including soybean, sesame, sunflower, olive, peanut, safflower, corn, canola, and cottonseed. Salad dressings or mayonnaise that are made with a vegetable oil. Limit added fats and oils that you use for cooking, baking, salads, and as spreads. Other Cocoa powder. Coffee and tea. All seasonings and condiments. The items listed above may not be a complete list of recommended foods or beverages. Contact your dietitian for more options. What foods are not recommended? Grains Breads that are made with saturated or trans fats, oils, or whole milk. Croissants. Butter rolls. Cheese breads. Sweet rolls. Donuts. Buttered popcorn. Chow mein noodles. High-fat crackers, such as cheese or butter crackers. Meats and Other Protein Sources Fatty meats, such  as hotdogs, short ribs, sausage, spareribs, bacon, ribeye roast or steak, and mutton. High-fat deli meats, such as salami and bologna. Caviar. Domestic duck and goose. Organ meats, such as kidney, liver, sweetbreads, brains, gizzard, chitterlings, and heart. Dairy Cream, sour cream, cream cheese, and creamed cottage cheese. Whole milk cheeses, including blue (bleu), Monterey Jack, Brie, [place], American, Havarti, Swiss, cheddar, Camembert, and [place]. Whole or 2% milk that is liquid, evaporated, or condensed. Whole buttermilk. Cream sauce or high-fat cheese sauce. Yogurt that is made from whole milk. Beverages Regular sodas and drinks with added sugar. Sweets and Desserts Frosting. Pudding. Cookies. Cakes other than angel food cake. Candy that has milk chocolate or white chocolate, hydrogenated fat, butter, coconut, or unknown ingredients. Buttered syrups. Full-fat ice cream or ice cream drinks. Fats and Oils Gravy that has suet, meat fat, or shortening. Cocoa butter, hydrogenated oils, palm oil, coconut oil, palm kernel oil. These can often be found in baked products, candy, fried foods, nondairy creamers, and whipped toppings. Solid fats and shortenings, including bacon fat, salt pork, lard, and butter. Nondairy cream substitutes, such as coffee creamers and sour cream substitutes. Salad dressings that are made of unknown oils, cheese, or sour cream. The items listed above may not be a complete list of foods and beverages to avoid. Contact your dietitian for more information. This information is not intended to replace advice given to you by your health care provider. Make sure you discuss any questions you have with your health care   provider. Document Released: 08/24/2008 Document Revised: 06/04/2016 Document Reviewed: 05/09/2014 Elsevier Interactive Patient Education  2018 [employer]  Topiramate tablets What is this medicine? TOPIRAMATE (toe PYRE a mate) is used to treat seizures in adults  or children with epilepsy. It is also used for the prevention of migraine headaches. This medicine may be used for other purposes; ask your health care provider or pharmacist if you have questions. COMMON BRAND NAME(S): Topamax, Topiragen What should I tell my health care provider before I take this medicine? They need to know if you have any of these conditions: -bleeding disorders -cirrhosis of the liver or liver disease -diarrhea -glaucoma -kidney stones or kidney disease -low blood counts, like low white cell, platelet, or red cell counts -lung disease like asthma, obstructive pulmonary disease, emphysema -metabolic acidosis -on a ketogenic diet -schedule for surgery or a procedure -suicidal thoughts, plans, or attempt; a previous suicide attempt by you or a family member -an unusual or allergic reaction to topiramate, other medicines, foods, dyes, or preservatives -pregnant or trying to get pregnant -breast-feeding How should I use this medicine? Take this medicine by mouth with a glass of water. Follow the directions on the prescription label. Do not crush or chew. You may take this medicine with meals. Take your medicine at regular intervals. Do not take it more often than directed. Talk to your pediatrician regarding the use of this medicine in children. Special care may be needed. While this drug may be prescribed for children as young as 782 years of age for selected conditions, precautions do apply. Overdosage: If you think you have taken too much of this medicine contact a poison control center or emergency room at once. NOTE: This medicine is only for you. Do not share this medicine with others. What if I miss a dose? If you miss a dose, take it as soon as you can. If your next dose is to be taken in less than 6 hours, then do not take the missed dose. Take the next dose at your regular time. Do not take double or extra doses. What may interact with this medicine? Do not take  this medicine with any of the following medications: -probenecid This medicine may also interact with the following medications: -acetazolamide -alcohol -amitriptyline -aspirin and aspirin-like medicines -birth control pills -certain medicines for depression -certain medicines for seizures -certain medicines that treat or prevent blood clots like warfarin, enoxaparin, dalteparin, apixaban, dabigatran, and rivaroxaban -digoxin -hydrochlorothiazide -lithium -medicines for pain, sleep, or muscle relaxation -metformin -methazolamide -NSAIDS, medicines for pain and inflammation, like ibuprofen or naproxen -pioglitazone -risperidone This list may not describe all possible interactions. Give your health care provider a list of all the medicines, herbs, non-prescription drugs, or dietary supplements you use. Also tell them if you smoke, drink alcohol, or use illegal drugs. Some items may interact with your medicine. What should I watch for while using this medicine? Visit your doctor or health care professional for regular checks on your progress. Do not stop taking this medicine suddenly. This increases the risk of seizures if you are using this medicine to control epilepsy. Wear a medical identification bracelet or chain to say you have epilepsy or seizures, and carry a card that lists all your medicines. This medicine can decrease sweating and increase your body temperature. Watch for signs of deceased sweating or fever, especially in children. Avoid extreme heat, hot baths, and saunas. Be careful about exercising, especially in hot weather. Contact your health care provider right  away if you notice a fever or decrease in sweating. You should drink plenty of fluids while taking this medicine. If you have had kidney stones in the past, this will help to reduce your chances of forming kidney stones. If you have stomach pain, with nausea or vomiting and yellowing of your eyes or skin, call your  doctor immediately. You may get drowsy, dizzy, or have blurred vision. Do not drive, use machinery, or do anything that needs mental alertness until you know how this medicine affects you. To reduce dizziness, do not sit or stand up quickly, especially if you are an older patient. Alcohol can increase drowsiness and dizziness. Avoid alcoholic drinks. If you notice blurred vision, eye pain, or other eye problems, seek medical attention at once for an eye exam. The use of this medicine may increase the chance of suicidal thoughts or actions. Pay special attention to how you are responding while on this medicine. Any worsening of mood, or thoughts of suicide or dying should be reported to your health care professional right away. This medicine may increase the chance of developing metabolic acidosis. If left untreated, this can cause kidney stones, bone disease, or slowed growth in children. Symptoms include breathing fast, fatigue, loss of appetite, irregular heartbeat, or loss of consciousness. Call your doctor immediately if you experience any of these side effects. Also, tell your doctor about any surgery you plan on having while taking this medicine since this may increase your risk for metabolic acidosis. Birth control pills may not work properly while you are taking this medicine. Talk to your doctor about using an extra method of birth control. Women who become pregnant while using this medicine may enroll in the [place] Antiepileptic Drug Pregnancy Registry by calling [phone]. This registry collects information about the safety of antiepileptic drug use during pregnancy. What side effects may I notice from receiving this medicine? Side effects that you should report to your doctor or health care professional as soon as possible: -allergic reactions like skin rash, itching or hives, swelling of the face, lips, or tongue -decreased sweating and/or rise in body  temperature -depression -difficulty breathing, fast or irregular breathing patterns -difficulty speaking -difficulty walking or controlling muscle movements -hearing impairment -redness, blistering, peeling or loosening of the skin, including inside the mouth -tingling, pain or numbness in the hands or feet -unusual bleeding or bruising -unusually weak or tired -worsening of mood, thoughts or actions of suicide or dying Side effects that usually do not require medical attention (report to your doctor or health care professional if they continue or are bothersome): -altered taste -back pain, joint or muscle aches and pains -diarrhea, or constipation -headache -loss of appetite -nausea -stomach upset, indigestion -tremors This list may not describe all possible side effects. Call your doctor for medical advice about side effects. You may report side effects to FDA at 1-800-FDA-1088. Where should I keep my medicine? Keep out of the reach of children. Store at room temperature between 15 and 30 degrees C (59 and 86 degrees F) in a tightly closed container. Protect from moisture. Throw away any unused medicine after the expiration date. NOTE: This sheet is a summary. It may not cover all possible information. If you have questions about this medicine, talk to your doctor, pharmacist, or health care provider.  2018 Elsevier/Gold Standard (2013-11-19 23:17:57)  Continue excellent water intake and continue to exercise regularly. Please start Topamax 25mg - 1 tablet nightly for first week, then increase to 1 tablet  twice daily. Continue to avoid tobacco/alcohol. Please schedule an appt in 4 weeks- fasting labs, complete physical, and to assess how your tolerating Topamax. WELCOME TO THE PRACTICE!

## 2017-12-05 NOTE — Assessment & Plan Note (Signed)
Please start Topamax 25mg - 1 tablet nightly for first week, then increase to 1 tablet twice daily.

## 2017-12-06 ENCOUNTER — Encounter: Payer: Self-pay | Admitting: Adult Health

## 2017-12-13 ENCOUNTER — Ambulatory Visit (INDEPENDENT_AMBULATORY_CARE_PROVIDER_SITE_OTHER): Payer: 59 | Admitting: Adult Health

## 2017-12-13 ENCOUNTER — Encounter: Payer: Self-pay | Admitting: Adult Health

## 2017-12-13 VITALS — BP 97/65 | HR 88 | Ht 69.0 in | Wt 279.0 lb

## 2017-12-13 DIAGNOSIS — N760 Acute vaginitis: Secondary | ICD-10-CM | POA: Insufficient documentation

## 2017-12-13 DIAGNOSIS — N898 Other specified noninflammatory disorders of vagina: Secondary | ICD-10-CM

## 2017-12-13 NOTE — Addendum Note (Signed)
Addended by: Stan HeadNELSON, Spyros Winch S on: 12/13/2017 09:23 AM   Modules accepted: Orders

## 2017-12-13 NOTE — Patient Instructions (Signed)
Vaginitis Vaginitis is a condition in which the vaginal tissue swells and becomes red (inflamed). This condition is most often caused by a change in the normal balance of bacteria and yeast that live in the vagina. This change causes an overgrowth of certain bacteria or yeast, which causes the inflammation. There are different types of vaginitis, but the most common types are:  Bacterial vaginosis.  Yeast infection (candidiasis).  Trichomoniasis vaginitis. This is a sexually transmitted disease (STD).  Viral vaginitis.  Atrophic vaginitis.  Allergic vaginitis.  What are the causes? The cause of this condition depends on the type of vaginitis. It can be caused by:  Bacteria (bacterial vaginosis).  Yeast, which is a fungus (yeast infection).  A parasite (trichomoniasis vaginitis).  A virus (viral vaginitis).  Low hormone levels (atrophic vaginitis). Low hormone levels can occur during pregnancy, breastfeeding, or after menopause.  Irritants, such as bubble baths, scented tampons, and feminine sprays (allergic vaginitis).  Other factors can change the normal balance of the yeast and bacteria that live in the vagina. These include:  Antibiotic medicines.  Poor hygiene.  Diaphragms, vaginal sponges, spermicides, birth control pills, and intrauterine devices (IUD).  Sex.  Infection.  Uncontrolled diabetes.  A weakened defense (immune) system.  What increases the risk? This condition is more likely to develop in women who:  Smoke.  Use vaginal douches, scented tampons, or scented sanitary pads.  Wear tight-fitting pants.  Wear thong underwear.  Use oral birth control pills or an IUD.  Have sex without a condom.  Have multiple sex partners.  Have an STD.  Frequently use the spermicide nonoxynol-9.  Eat lots of foods high in sugar.  Have uncontrolled diabetes.  Have low estrogen levels.  Have a weakened immune system from an immune disorder or medical  treatment.  Are pregnant or breastfeeding.  What are the signs or symptoms? Symptoms vary depending on the cause of the vaginitis. Common symptoms include:  Abnormal vaginal discharge. ? The discharge is white, gray, or yellow with bacterial vaginosis. ? The discharge is thick, white, and cheesy with a yeast infection. ? The discharge is frothy and yellow or greenish with trichomoniasis.  A bad vaginal smell. The smell is fishy with bacterial vaginosis.  Vaginal itching, pain, or swelling.  Sex that is painful.  Pain or burning when urinating.  Sometimes there are no symptoms. How is this diagnosed? This condition is diagnosed based on your symptoms and medical history. A physical exam, including a pelvic exam, will also be done. You may also have other tests, including:  Tests to determine the pH level (acidity or alkalinity) of your vagina.  A whiff test, to assess the odor that results when a sample of your vaginal discharge is mixed with a potassium hydroxide solution.  Tests of vaginal fluid. A sample will be examined under a microscope.  How is this treated? Treatment varies depending on the type of vaginitis you have. Your treatment may include:  Antibiotic creams or pills to treat bacterial vaginosis and trichomoniasis.  Antifungal medicines, such as vaginal creams or suppositories, to treat a yeast infection.  Medicine to ease discomfort if you have viral vaginitis. Your sexual partner should also be treated.  Estrogen delivered in a cream, pill, suppository, or vaginal ring to treat atrophic vaginitis. If vaginal dryness occurs, lubricants and moisturizing creams may help. You may need to avoid scented soaps, sprays, or douches.  Stopping use of a product that is causing allergic vaginitis. Then using a vaginal  cream to treat the symptoms.  Follow these instructions at home: Lifestyle  Keep your genital area clean and dry. Avoid soap, and only rinse the area  with water.  Do not douche or use tampons until your health care provider says it is okay to do so. Use sanitary pads, if needed.  Do not have sex until your health care provider approves. When you can return to sex, practice safe sex and use condoms.  Wipe from front to back. This avoids the spread of bacteria from the rectum to the vagina. General instructions  Take over-the-counter and prescription medicines only as told by your health care provider.  If you were prescribed an antibiotic medicine, take or use it as told by your health care provider. Do not stop taking or using the antibiotic even if you start to feel better.  Keep all follow-up visits as told by your health care provider. This is important. How is this prevented?  Use mild, non-scented products. Do not use things that can irritate the vagina, such as fabric softeners. Avoid the following products if they are scented: ? Feminine sprays. ? Detergents. ? Tampons. ? Feminine hygiene products. ? Soaps or bubble baths.  Let air reach your genital area. ? Wear cotton underwear to reduce moisture buildup. ? Avoid wearing underwear while you sleep. ? Avoid wearing tight pants and underwear or nylons without a cotton panel. ? Avoid wearing thong underwear.  Take off any wet clothing, such as bathing suits, as soon as possible.  Practice safe sex and use condoms. Contact a health care provider if:  You have abdominal pain.  You have a fever.  You have symptoms that last for more than 2-3 days. Get help right away if:  You have a fever and your symptoms suddenly get worse. Summary  Vaginitis is a condition in which the vaginal tissue becomes inflamed.This condition is most often caused by a change in the normal balance of bacteria and yeast that live in the vagina.  Treatment varies depending on the type of vaginitis you have.  Do not douche, use tampons , or have sex until your health care provider approves.  When you can return to sex, practice safe sex and use condoms. This information is not intended to replace advice given to you by your health care provider. Make sure you discuss any questions you have with your health care provider. Document Released: 09/12/2007 Document Revised: 12/21/2016 Document Reviewed: 12/21/2016 Elsevier Interactive Patient Education  2018 [employer]  Vaginal swab completed, once results available will treat accordingly. Do not over-wash/clean genital area. Do not wear restrictive clothing. FEEL BETTER!
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
## 2017-12-13 NOTE — Assessment & Plan Note (Signed)
Vaginal swab completed, once results available will treat accordingly. Do not over-wash/clean genital area. Do not wear restrictive clothing. 

## 2017-12-13 NOTE — Assessment & Plan Note (Signed)
Vaginal swab completed, once results available will treat accordingly. Do not over-wash/clean genital area. Do not wear restrictive clothing.

## 2017-12-15 ENCOUNTER — Other Ambulatory Visit: Payer: Self-pay | Admitting: Adult Health

## 2017-12-15 LAB — NUSWAB VAGINITIS (VG)
BVAB 2: HIGH Score — AB
CANDIDA GLABRATA, NAA: NEGATIVE
Candida albicans, NAA: NEGATIVE
Megasphaera 1: HIGH Score — AB
Trich vag by NAA: NEGATIVE

## 2017-12-15 MED ORDER — METRONIDAZOLE 500 MG PO TABS: 500.0000 mg | ORAL_TABLET | Freq: Two times a day (BID) | ORAL | 0 refills | Status: DC

## 2017-12-15 NOTE — Progress Notes (Unsigned)
flagyl

## 2018-01-02 ENCOUNTER — Other Ambulatory Visit: Payer: Medicaid Other

## 2018-01-02 DIAGNOSIS — D573 Sickle-cell trait: Secondary | ICD-10-CM

## 2018-01-02 DIAGNOSIS — R5383 Other fatigue: Secondary | ICD-10-CM

## 2018-01-02 DIAGNOSIS — Z833 Family history of diabetes mellitus: Secondary | ICD-10-CM

## 2018-01-03 ENCOUNTER — Other Ambulatory Visit: Payer: Self-pay | Admitting: Adult Health

## 2018-01-03 DIAGNOSIS — E559 Vitamin D deficiency, unspecified: Secondary | ICD-10-CM

## 2018-01-03 LAB — CBC WITH DIFFERENTIAL/PLATELET
BASOS ABS: 0 10*3/uL (ref 0.0–0.2)
Basos: 0 %
EOS (ABSOLUTE): 0.1 10*3/uL (ref 0.0–0.4)
Eos: 1 %
Hematocrit: 36.2 % (ref 34.0–46.6)
Hemoglobin: 12.1 g/dL (ref 11.1–15.9)
IMMATURE GRANS (ABS): 0 10*3/uL (ref 0.0–0.1)
Immature Granulocytes: 0 %
LYMPHS: 23 %
Lymphocytes Absolute: 2.5 10*3/uL (ref 0.7–3.1)
MCH: 29.5 pg (ref 26.6–33.0)
MCHC: 33.4 g/dL (ref 31.5–35.7)
MCV: 88 fL (ref 79–97)
MONOS ABS: 0.9 10*3/uL (ref 0.1–0.9)
Monocytes: 9 %
NEUTROS ABS: 7.1 10*3/uL — AB (ref 1.4–7.0)
Neutrophils: 67 %
PLATELETS: 223 10*3/uL (ref 150–379)
RBC: 4.1 x10E6/uL (ref 3.77–5.28)
RDW: 14.2 % (ref 12.3–15.4)
WBC: 10.6 10*3/uL (ref 3.4–10.8)

## 2018-01-03 LAB — HEMOGLOBIN A1C
ESTIMATED AVERAGE GLUCOSE: 108 mg/dL
HEMOGLOBIN A1C: 5.4 % (ref 4.8–5.6)

## 2018-01-03 LAB — LIPID PANEL
Chol/HDL Ratio: 3.5 ratio (ref 0.0–4.4)
Cholesterol, Total: 125 mg/dL (ref 100–199)
HDL: 36 mg/dL — ABNORMAL LOW (ref >39)
LDL CALC: 68 mg/dL (ref 0–99)
Triglycerides: 106 mg/dL (ref 0–149)
VLDL CHOLESTEROL CAL: 21 mg/dL (ref 5–40)

## 2018-01-03 LAB — COMPREHENSIVE METABOLIC PANEL
A/G RATIO: 1.4 (ref 1.2–2.2)
ALT: 12 IU/L (ref 0–32)
AST: 14 IU/L (ref 0–40)
Albumin: 3.9 g/dL (ref 3.5–5.5)
Alkaline Phosphatase: 33 IU/L — ABNORMAL LOW (ref 39–117)
BUN/Creatinine Ratio: 9 (ref 9–23)
BUN: 6 mg/dL (ref 6–20)
CHLORIDE: 105 mmol/L (ref 96–106)
CO2: 19 mmol/L — ABNORMAL LOW (ref 20–29)
Calcium: 8.9 mg/dL (ref 8.7–10.2)
Creatinine, Ser: 0.68 mg/dL (ref 0.57–1.00)
GFR calc non Af Amer: 121 mL/min/{1.73_m2} (ref >59)
GFR, EST AFRICAN AMERICAN: 140 mL/min/{1.73_m2} (ref >59)
GLOBULIN, TOTAL: 2.7 g/dL (ref 1.5–4.5)
Glucose: 93 mg/dL (ref 65–99)
POTASSIUM: 4.1 mmol/L (ref 3.5–5.2)
SODIUM: 140 mmol/L (ref 134–144)
TOTAL PROTEIN: 6.6 g/dL (ref 6.0–8.5)

## 2018-01-03 LAB — VITAMIN D 25 HYDROXY (VIT D DEFICIENCY, FRACTURES): Vit D, 25-Hydroxy: 9.3 ng/mL — ABNORMAL LOW (ref 30.0–100.0)

## 2018-01-03 LAB — TSH: TSH: 0.865 u[IU]/mL (ref 0.450–4.500)

## 2018-01-03 MED ORDER — VITAMIN D (ERGOCALCIFEROL) 1.25 MG (50000 UNIT) PO CAPS: 50000.0000 [IU] | ORAL_CAPSULE | ORAL | 0 refills | Status: DC

## 2018-01-04 ENCOUNTER — Ambulatory Visit: Payer: 59 | Admitting: Adult Health

## 2018-01-05 ENCOUNTER — Encounter: Payer: Self-pay | Admitting: Adult Health

## 2018-01-05 ENCOUNTER — Ambulatory Visit (INDEPENDENT_AMBULATORY_CARE_PROVIDER_SITE_OTHER): Payer: 59 | Admitting: Adult Health

## 2018-01-05 VITALS — BP 94/62 | HR 66 | Ht 69.0 in | Wt 285.7 lb

## 2018-01-05 DIAGNOSIS — Z0000 Encounter for general adult medical examination without abnormal findings: Secondary | ICD-10-CM

## 2018-01-05 DIAGNOSIS — G43901 Migraine, unspecified, not intractable, with status migrainosus: Secondary | ICD-10-CM

## 2018-01-05 NOTE — Patient Instructions (Addendum)
Heart-Healthy Eating Plan Many factors influence your heart health, including eating and exercise habits. Heart (coronary) risk increases with abnormal blood fat (lipid) levels. Heart-healthy meal planning includes limiting unhealthy fats, increasing healthy fats, and making other small dietary changes. This includes maintaining a healthy body weight to help keep lipid levels within a normal range. What is my plan? Your health care provider recommends that you:  Get no more than __25__% of the total calories in your daily diet from fat.  Limit your intake of saturated fat to less than ___5___% of your total calories each day.  Limit the amount of cholesterol in your diet to less than _300_ mg per day.  What types of fat should I choose?  Choose healthy fats more often. Choose monounsaturated and polyunsaturated fats, such as olive oil and canola oil, flaxseeds, walnuts, almonds, and seeds.  Eat more omega-3 fats. Good choices include salmon, mackerel, sardines, tuna, flaxseed oil, and ground flaxseeds. Aim to eat fish at least two times each week.  Limit saturated fats. Saturated fats are primarily found in animal products, such as meats, butter, and cream. Plant sources of saturated fats include palm oil, palm kernel oil, and coconut oil.  Avoid foods with partially hydrogenated oils in them. These contain trans fats. Examples of foods that contain trans fats are stick margarine, some tub margarines, cookies, crackers, and other baked goods. What general guidelines do I need to follow?  Check food labels carefully to identify foods with trans fats or high amounts of saturated fat.  Fill one half of your plate with vegetables and green salads. Eat 4-5 servings of vegetables per day. A serving of vegetables equals 1 cup of raw leafy vegetables,  cup of raw or cooked cut-up vegetables, or  cup of vegetable juice.  Fill one fourth of your plate with whole grains. Look for the word "whole" as  the first word in the ingredient list.  Fill one fourth of your plate with lean protein foods.  Eat 4-5 servings of fruit per day. A serving of fruit equals one medium whole fruit,  cup of dried fruit,  cup of fresh, frozen, or canned fruit, or  cup of 100% fruit juice.  Eat more foods that contain soluble fiber. Examples of foods that contain this type of fiber are apples, broccoli, carrots, beans, peas, and barley. Aim to get 20-30 g of fiber per day.  Eat more home-cooked food and less restaurant, buffet, and fast food.  Limit or avoid alcohol.  Limit foods that are high in starch and sugar.  Avoid fried foods.  Cook foods by using methods other than frying. Baking, boiling, grilling, and broiling are all great options. Other fat-reducing suggestions include: ? Removing the skin from poultry. ? Removing all visible fats from meats. ? Skimming the fat off of stews, soups, and gravies before serving them. ? Steaming vegetables in water or broth.  Lose weight if you are overweight. Losing just 5-10% of your initial body weight can help your overall health and prevent diseases such as diabetes and heart disease.  Increase your consumption of nuts, legumes, and seeds to 4-5 servings per week. One serving of dried beans or legumes equals  cup after being cooked, one serving of nuts equals 1 ounces, and one serving of seeds equals  ounce or 1 tablespoon.  You may need to monitor your salt (sodium) intake, especially if you have high blood pressure. Talk with your health care provider or dietitian to get  more information about reducing sodium. What foods can I eat? Grains  Breads, including [place], white, pita, wheat, raisin, rye, oatmeal, and [place]. Tortillas that are neither fried nor made with lard or trans fat. Low-fat rolls, including hotdog and hamburger buns and English muffins. Biscuits. Muffins. Waffles. Pancakes. Light popcorn. Whole-grain cereals. Flatbread. Melba toast.  Pretzels. Breadsticks. Rusks. Low-fat snacks and crackers, including oyster, saltine, matzo, graham, animal, and rye. Rice and pasta, including brown rice and those that are made with whole wheat. Vegetables All vegetables. Fruits All fruits, but limit coconut. Meats and Other Protein Sources Lean, well-trimmed beef, veal, pork, and lamb. Chicken and [place] without skin. All fish and shellfish. Wild duck, rabbit, pheasant, and venison. Egg whites or low-cholesterol egg substitutes. Dried beans, peas, lentils, and tofu.Seeds and most nuts. Dairy Low-fat or nonfat cheeses, including ricotta, string, and mozzarella. Skim or 1% milk that is liquid, powdered, or evaporated. Buttermilk that is made with low-fat milk. Nonfat or low-fat yogurt. Beverages Mineral water. Diet carbonated beverages. Sweets and Desserts Sherbets and fruit ices. Honey, jam, marmalade, jelly, and syrups. Meringues and gelatins. Pure sugar candy, such as hard candy, jelly beans, gumdrops, mints, marshmallows, and small amounts of dark chocolate. [employer] Eat all sweets and desserts in moderation. Fats and Oils Nonhydrogenated (trans-free) margarines. Vegetable oils, including soybean, sesame, sunflower, olive, peanut, safflower, corn, canola, and cottonseed. Salad dressings or mayonnaise that are made with a vegetable oil. Limit added fats and oils that you use for cooking, baking, salads, and as spreads. Other Cocoa powder. Coffee and tea. All seasonings and condiments. The items listed above may not be a complete list of recommended foods or beverages. Contact your dietitian for more options. What foods are not recommended? Grains Breads that are made with saturated or trans fats, oils, or whole milk. Croissants. Butter rolls. Cheese breads. Sweet rolls. Donuts. Buttered popcorn. Chow mein noodles. High-fat crackers, such as cheese or butter crackers. Meats and Other Protein Sources Fatty meats, such as hotdogs,  short ribs, sausage, spareribs, bacon, ribeye roast or steak, and mutton. High-fat deli meats, such as salami and bologna. Caviar. Domestic duck and goose. Organ meats, such as kidney, liver, sweetbreads, brains, gizzard, chitterlings, and heart. Dairy Cream, sour cream, cream cheese, and creamed cottage cheese. Whole milk cheeses, including blue (bleu), Monterey Jack, [place], [place], American, [place], Swiss, [place], [place], and [place]. Whole or 2% milk that is liquid, evaporated, or condensed. Whole buttermilk. Cream sauce or high-fat cheese sauce. Yogurt that is made from whole milk. Beverages Regular sodas and drinks with added sugar. Sweets and Desserts Frosting. Pudding. Cookies. Cakes other than angel food cake. Candy that has milk chocolate or white chocolate, hydrogenated fat, butter, coconut, or unknown ingredients. Buttered syrups. Full-fat ice cream or ice cream drinks. Fats and Oils Gravy that has suet, meat fat, or shortening. Cocoa butter, hydrogenated oils, palm oil, coconut oil, palm kernel oil. These can often be found in baked products, candy, fried foods, nondairy creamers, and whipped toppings. Solid fats and shortenings, including bacon fat, salt pork, lard, and butter. Nondairy cream substitutes, such as coffee creamers and sour cream substitutes. Salad dressings that are made of unknown oils, cheese, or sour cream. The items listed above may not be a complete list of foods and beverages to avoid. Contact your dietitian for more information. This information is not intended to replace advice given to you by your health care provider. Make sure you discuss any questions you have with your health care  provider. Document Released: 08/24/2008 Document Revised: 06/04/2016 Document Reviewed: 05/09/2014 Elsevier Interactive Patient Education  2018 [employer]  Labs look good! Keep drinking plenty of water, exercise regularly, and follow heart healthy diet. Avoid NSAIDs,  Acetaminophen is safe in pregnancy. Hope the pregnancy goes well! Annual physical with fasting labs.  NICE TO SEE YOU!

## 2018-01-05 NOTE — Assessment & Plan Note (Signed)
Labs look good! Keep drinking plenty of water, exercise regularly, and follow heart healthy diet. Avoid NSAIDs, Acetaminophen is safe in pregnancy. Hope the pregnancy goes well! Annual physical with fasting labs.

## 2018-01-05 NOTE — Assessment & Plan Note (Signed)
Stable She has stopped taking Topamax since + pregnancy test Continue excellent hydration, OTC Acetaminophen safe in pregnancy- do not use NSAIDs

## 2018-01-05 NOTE — Assessment & Plan Note (Signed)
BMI 42 Continue excellent hydration, heart healthy diet, and regular exercise
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
## 2018-01-06 DIAGNOSIS — Z3490 Encounter for supervision of normal pregnancy, unspecified, unspecified trimester: Secondary | ICD-10-CM

## 2018-01-06 LAB — OB RESULTS CONSOLE HIV ANTIBODY (ROUTINE TESTING): HIV: NONREACTIVE

## 2018-01-06 LAB — OB RESULTS CONSOLE ABO/RH: RH Type: NEGATIVE

## 2018-01-06 LAB — OB RESULTS CONSOLE RPR: RPR: NONREACTIVE

## 2018-01-06 LAB — OB RESULTS CONSOLE HEPATITIS B SURFACE ANTIGEN: HEP B S AG: NEGATIVE

## 2018-01-06 LAB — OB RESULTS CONSOLE ANTIBODY SCREEN: Antibody Screen: NEGATIVE

## 2018-01-06 LAB — OB RESULTS CONSOLE GC/CHLAMYDIA
CHLAMYDIA, DNA PROBE: NEGATIVE
GC PROBE AMP, GENITAL: NEGATIVE

## 2018-01-06 LAB — OB RESULTS CONSOLE RUBELLA ANTIBODY, IGM: RUBELLA: IMMUNE

## 2018-01-09 ENCOUNTER — Encounter: Payer: Self-pay | Admitting: Adult Health

## 2018-01-10 DIAGNOSIS — Z889 Allergy status to unspecified drugs, medicaments and biological substances status: Secondary | ICD-10-CM | POA: Insufficient documentation

## 2018-01-30 ENCOUNTER — Other Ambulatory Visit: Payer: Self-pay

## 2018-01-30 ENCOUNTER — Encounter (HOSPITAL_COMMUNITY): Payer: Self-pay | Admitting: *Deleted

## 2018-01-30 ENCOUNTER — Emergency Department (HOSPITAL_COMMUNITY)
Admission: EM | Admit: 2018-01-30 | Discharge: 2018-01-30 | Disposition: A | Discharge status: Home / Self Care | Payer: Medicaid Other | Attending: Emergency Medicine | Admitting: Emergency Medicine

## 2018-01-30 DIAGNOSIS — R0981 Nasal congestion: Secondary | ICD-10-CM | POA: Diagnosis not present

## 2018-01-30 DIAGNOSIS — J069 Acute upper respiratory infection, unspecified: Secondary | ICD-10-CM | POA: Diagnosis not present

## 2018-01-30 DIAGNOSIS — Z79899 Other long term (current) drug therapy: Secondary | ICD-10-CM | POA: Diagnosis not present

## 2018-01-30 DIAGNOSIS — M791 Myalgia, unspecified site: Secondary | ICD-10-CM | POA: Insufficient documentation

## 2018-01-30 DIAGNOSIS — R1310 Dysphagia, unspecified: Secondary | ICD-10-CM | POA: Insufficient documentation

## 2018-01-30 DIAGNOSIS — F1210 Cannabis abuse, uncomplicated: Secondary | ICD-10-CM | POA: Diagnosis not present

## 2018-01-30 DIAGNOSIS — R070 Pain in throat: Secondary | ICD-10-CM | POA: Diagnosis not present

## 2018-01-30 DIAGNOSIS — R05 Cough: Secondary | ICD-10-CM | POA: Insufficient documentation

## 2018-01-30 DIAGNOSIS — R51 Headache: Secondary | ICD-10-CM | POA: Diagnosis not present

## 2018-01-30 DIAGNOSIS — O9989 Other specified diseases and conditions complicating pregnancy, childbirth and the puerperium: Secondary | ICD-10-CM | POA: Diagnosis not present

## 2018-01-30 DIAGNOSIS — Z3A09 9 weeks gestation of pregnancy: Secondary | ICD-10-CM | POA: Insufficient documentation

## 2018-01-30 LAB — INFLUENZA PANEL BY PCR (TYPE A & B)
INFLBPCR: NEGATIVE
Influenza A By PCR: NEGATIVE

## 2018-01-30 MED ORDER — METOCLOPRAMIDE HCL 10 MG PO TABS
10.0000 mg | ORAL_TABLET | Freq: Once | ORAL | Status: AC
  Administered 2018-01-30: 10 mg via ORAL
  Filled 2018-01-30: qty 1

## 2018-01-30 MED ORDER — ACETAMINOPHEN 500 MG PO TABS
500.0000 mg | ORAL_TABLET | Freq: Once | ORAL | Status: AC
  Administered 2018-01-30: 500 mg via ORAL
  Filled 2018-01-30: qty 1

## 2018-01-30 NOTE — ED Triage Notes (Signed)
The pt is c/o generalized body aches chills sweating with cough headache cold for 3 days  lmp dec 31 p weeks pregnant

## 2018-01-30 NOTE — ED Notes (Signed)
Pt complains of generalized body aches for 3 days. Pt has not sought any medical care until this date.
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
## 2018-01-30 NOTE — Discharge Instructions (Addendum)
Please read instructions below.  Your flu swab was negative. You can take tylenol as needed for sore throat or body aches.  Drink plenty of water.  You can take unisom (doxylamine) at bedtime, with vitamin B6 (50-100mg ) as needed for morning sickness. Follow up with your primary care provider.  Return to the ER for difficulty swallowing liquids, difficulty breathing, or new or worsening symptoms.

## 2018-02-19 DIAGNOSIS — O99210 Obesity complicating pregnancy, unspecified trimester: Secondary | ICD-10-CM | POA: Diagnosis present

## 2018-02-19 DIAGNOSIS — G8929 Other chronic pain: Secondary | ICD-10-CM | POA: Insufficient documentation

## 2018-02-19 DIAGNOSIS — R51 Headache: Secondary | ICD-10-CM

## 2018-02-27 ENCOUNTER — Other Ambulatory Visit: Payer: Self-pay | Admitting: Adult Health

## 2018-03-14 ENCOUNTER — Encounter (HOSPITAL_COMMUNITY): Payer: Self-pay | Admitting: *Deleted

## 2018-03-14 ENCOUNTER — Inpatient Hospital Stay (HOSPITAL_COMMUNITY)
Admission: AD | Admit: 2018-03-14 | Discharge: 2018-03-14 | Disposition: A | Discharge status: Home / Self Care | Payer: Medicaid Other | Source: Ambulatory Visit | Attending: Obstetrics and Gynecology | Admitting: Obstetrics and Gynecology

## 2018-03-14 DIAGNOSIS — R102 Pelvic and perineal pain: Secondary | ICD-10-CM | POA: Insufficient documentation

## 2018-03-14 DIAGNOSIS — O26892 Other specified pregnancy related conditions, second trimester: Secondary | ICD-10-CM | POA: Diagnosis not present

## 2018-03-14 DIAGNOSIS — Z3A15 15 weeks gestation of pregnancy: Secondary | ICD-10-CM | POA: Insufficient documentation

## 2018-03-14 DIAGNOSIS — R109 Unspecified abdominal pain: Secondary | ICD-10-CM | POA: Insufficient documentation

## 2018-03-14 LAB — URINALYSIS, ROUTINE W REFLEX MICROSCOPIC
BILIRUBIN URINE: NEGATIVE
GLUCOSE, UA: NEGATIVE mg/dL
HGB URINE DIPSTICK: NEGATIVE
Ketones, ur: NEGATIVE mg/dL
NITRITE: NEGATIVE
Protein, ur: NEGATIVE mg/dL
SPECIFIC GRAVITY, URINE: 1.011 (ref 1.005–1.030)
pH: 5 (ref 5.0–8.0)

## 2018-03-14 NOTE — Discharge Instructions (Signed)
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
## 2018-03-14 NOTE — MAU Note (Signed)
[facility]  WITH  [facility]   .  STARTED TODAY -  FEELS UPPER RIGHT  ABD PAIN-  AND LOWER RIGHT  PAIN.   NO VAG BLEEDING.     DID NOT CALL DR.   NO MEDS FOR PAIN.     LAST SEX-  SAT.    SHE WORKS IN [employer].

## 2018-04-24 DIAGNOSIS — A749 Chlamydial infection, unspecified: Secondary | ICD-10-CM | POA: Insufficient documentation

## 2018-08-06 ENCOUNTER — Other Ambulatory Visit: Payer: Self-pay

## 2018-08-06 ENCOUNTER — Encounter (HOSPITAL_COMMUNITY): Payer: Self-pay | Admitting: *Deleted

## 2018-08-06 ENCOUNTER — Inpatient Hospital Stay (HOSPITAL_COMMUNITY)
Admission: AD | Admit: 2018-08-06 | Discharge: 2018-08-06 | Disposition: A | Discharge status: Home / Self Care | Payer: 59 | Source: Ambulatory Visit | Attending: Obstetrics and Gynecology | Admitting: Obstetrics and Gynecology

## 2018-08-06 DIAGNOSIS — M549 Dorsalgia, unspecified: Secondary | ICD-10-CM | POA: Diagnosis not present

## 2018-08-06 DIAGNOSIS — Z3A36 36 weeks gestation of pregnancy: Secondary | ICD-10-CM | POA: Diagnosis not present

## 2018-08-06 DIAGNOSIS — K5900 Constipation, unspecified: Secondary | ICD-10-CM | POA: Insufficient documentation

## 2018-08-06 DIAGNOSIS — Z888 Allergy status to other drugs, medicaments and biological substances status: Secondary | ICD-10-CM | POA: Diagnosis not present

## 2018-08-06 DIAGNOSIS — O26893 Other specified pregnancy related conditions, third trimester: Secondary | ICD-10-CM | POA: Insufficient documentation

## 2018-08-06 DIAGNOSIS — O99613 Diseases of the digestive system complicating pregnancy, third trimester: Secondary | ICD-10-CM | POA: Insufficient documentation

## 2018-08-06 DIAGNOSIS — O99353 Diseases of the nervous system complicating pregnancy, third trimester: Secondary | ICD-10-CM | POA: Insufficient documentation

## 2018-08-06 DIAGNOSIS — D573 Sickle-cell trait: Secondary | ICD-10-CM | POA: Diagnosis not present

## 2018-08-06 DIAGNOSIS — G5600 Carpal tunnel syndrome, unspecified upper limb: Secondary | ICD-10-CM | POA: Diagnosis not present

## 2018-08-06 DIAGNOSIS — Z3493 Encounter for supervision of normal pregnancy, unspecified, third trimester: Secondary | ICD-10-CM

## 2018-08-06 DIAGNOSIS — R1084 Generalized abdominal pain: Secondary | ICD-10-CM

## 2018-08-06 LAB — URINALYSIS, ROUTINE W REFLEX MICROSCOPIC
BILIRUBIN URINE: NEGATIVE
Glucose, UA: NEGATIVE mg/dL
HGB URINE DIPSTICK: NEGATIVE
KETONES UR: NEGATIVE mg/dL
Leukocytes, UA: NEGATIVE
Nitrite: NEGATIVE
PROTEIN: NEGATIVE mg/dL
Specific Gravity, Urine: 1.009 (ref 1.005–1.030)
pH: 6 (ref 5.0–8.0)

## 2018-08-06 NOTE — MAU Note (Signed)
Pt reports pressure in lower abdomen, back pain and contractions. Last BM yesterday, but it was a hard BM. Denies urinary frequency or pain. +FM. Denies Vaginal bleeding or LOF. Next appointment tomorrow at [facility], but was in too much pain to wait. Was fingertip dilated in office last week.

## 2018-08-06 NOTE — Discharge Instructions (Signed)
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
## 2018-08-15 ENCOUNTER — Telehealth (HOSPITAL_COMMUNITY): Payer: Self-pay | Admitting: *Deleted

## 2018-08-15 ENCOUNTER — Encounter (HOSPITAL_COMMUNITY): Payer: Self-pay | Admitting: *Deleted

## 2018-08-15 NOTE — Telephone Encounter (Signed)
Preadmission screen  

## 2018-08-18 ENCOUNTER — Other Ambulatory Visit: Payer: Self-pay | Admitting: Obstetrics and Gynecology

## 2018-08-27 ENCOUNTER — Encounter (HOSPITAL_COMMUNITY): Payer: Self-pay

## 2018-08-27 ENCOUNTER — Inpatient Hospital Stay (HOSPITAL_COMMUNITY)
Admission: RE | Admit: 2018-08-27 | Discharge: 2018-08-30 | DRG: 807 | Disposition: A | Discharge status: Home / Self Care | Payer: 59 | Attending: Obstetrics and Gynecology | Admitting: Obstetrics and Gynecology

## 2018-08-27 ENCOUNTER — Other Ambulatory Visit: Payer: Self-pay

## 2018-08-27 DIAGNOSIS — O26893 Other specified pregnancy related conditions, third trimester: Secondary | ICD-10-CM | POA: Diagnosis present

## 2018-08-27 DIAGNOSIS — Z3490 Encounter for supervision of normal pregnancy, unspecified, unspecified trimester: Secondary | ICD-10-CM

## 2018-08-27 DIAGNOSIS — Z6791 Unspecified blood type, Rh negative: Secondary | ICD-10-CM

## 2018-08-27 DIAGNOSIS — O9902 Anemia complicating childbirth: Secondary | ICD-10-CM | POA: Diagnosis present

## 2018-08-27 DIAGNOSIS — D573 Sickle-cell trait: Secondary | ICD-10-CM | POA: Diagnosis present

## 2018-08-27 DIAGNOSIS — O99214 Obesity complicating childbirth: Secondary | ICD-10-CM | POA: Diagnosis present

## 2018-08-27 DIAGNOSIS — Z3A39 39 weeks gestation of pregnancy: Secondary | ICD-10-CM | POA: Diagnosis not present

## 2018-08-27 DIAGNOSIS — O99210 Obesity complicating pregnancy, unspecified trimester: Secondary | ICD-10-CM | POA: Diagnosis present

## 2018-08-27 DIAGNOSIS — E669 Obesity, unspecified: Secondary | ICD-10-CM | POA: Diagnosis present

## 2018-08-27 LAB — CBC
HCT: 36.6 % (ref 36.0–46.0)
Hemoglobin: 12.8 g/dL (ref 12.0–15.0)
MCH: 29.1 pg (ref 26.0–34.0)
MCHC: 35 g/dL (ref 30.0–36.0)
MCV: 83.2 fL (ref 78.0–100.0)
PLATELETS: 214 10*3/uL (ref 150–400)
RBC: 4.4 MIL/uL (ref 3.87–5.11)
RDW: 14.2 % (ref 11.5–15.5)
WBC: 13.3 10*3/uL — ABNORMAL HIGH (ref 4.0–10.5)

## 2018-08-27 LAB — OB RESULTS CONSOLE GBS: GBS: NEGATIVE

## 2018-08-27 MED ORDER — SODIUM CHLORIDE 0.9% FLUSH: 3.0000 mL | Freq: Two times a day (BID) | INTRAVENOUS | Status: DC

## 2018-08-27 MED ORDER — FENTANYL CITRATE (PF) 100 MCG/2ML IJ SOLN
50.0000 ug | INTRAMUSCULAR | Status: DC | PRN
  Administered 2018-08-27 – 2018-08-28 (×3): 100 ug via INTRAVENOUS
  Filled 2018-08-27 (×3): qty 2

## 2018-08-27 MED ORDER — SODIUM CHLORIDE 0.9 % IV SOLN: 250.0000 mL | INTRAVENOUS | Status: DC | PRN

## 2018-08-27 MED ORDER — LACTATED RINGERS IV SOLN
500.0000 mL | INTRAVENOUS | Status: DC | PRN
  Administered 2018-08-28 (×2): 500 mL via INTRAVENOUS

## 2018-08-27 MED ORDER — OXYTOCIN BOLUS FROM INFUSION
500.0000 mL | Freq: Once | INTRAVENOUS | Status: AC
  Administered 2018-08-28: 500 mL via INTRAVENOUS

## 2018-08-27 MED ORDER — TERBUTALINE SULFATE 1 MG/ML IJ SOLN
0.2500 mg | Freq: Once | INTRAMUSCULAR | Status: DC | PRN
  Filled 2018-08-27: qty 1

## 2018-08-27 MED ORDER — LIDOCAINE HCL (PF) 1 % IJ SOLN
30.0000 mL | INTRAMUSCULAR | Status: DC | PRN
  Filled 2018-08-27: qty 30

## 2018-08-27 MED ORDER — OXYTOCIN 40 UNITS IN LACTATED RINGERS INFUSION - SIMPLE MED
1.0000 m[IU]/min | INTRAVENOUS | Status: DC
  Administered 2018-08-27: 2 m[IU]/min via INTRAVENOUS

## 2018-08-27 MED ORDER — OXYTOCIN 40 UNITS IN LACTATED RINGERS INFUSION - SIMPLE MED
2.5000 [IU]/h | INTRAVENOUS | Status: DC
  Filled 2018-08-27: qty 1000

## 2018-08-27 MED ORDER — ACETAMINOPHEN 325 MG PO TABS: 650.0000 mg | ORAL_TABLET | ORAL | Status: DC | PRN

## 2018-08-27 MED ORDER — SOD CITRATE-CITRIC ACID 500-334 MG/5ML PO SOLN: 30.0000 mL | ORAL | Status: DC | PRN

## 2018-08-27 MED ORDER — MISOPROSTOL 25 MCG QUARTER TABLET
25.0000 ug | ORAL_TABLET | ORAL | Status: DC | PRN
  Administered 2018-08-27 (×2): 25 ug via VAGINAL
  Filled 2018-08-27 (×3): qty 1

## 2018-08-27 MED ORDER — SODIUM CHLORIDE 0.9% FLUSH: 3.0000 mL | INTRAVENOUS | Status: DC | PRN

## 2018-08-27 MED ORDER — LACTATED RINGERS IV SOLN
INTRAVENOUS | Status: DC
  Administered 2018-08-27 – 2018-08-28 (×3): via INTRAVENOUS

## 2018-08-27 MED ORDER — ONDANSETRON HCL 4 MG/2ML IJ SOLN: 4.0000 mg | Freq: Four times a day (QID) | INTRAMUSCULAR | Status: DC | PRN

## 2018-08-27 MED ORDER — TERBUTALINE SULFATE 1 MG/ML IJ SOLN: 0.2500 mg | Freq: Once | INTRAMUSCULAR | Status: DC | PRN

## 2018-08-27 NOTE — Progress Notes (Signed)
Anne Ray is a 27 y.o. G3P1011 at [redacted]w[redacted]d admitted for induction of labor due to maternal obesity.  Subjective: Patient reports she is comfortable and feeling only mild, irregular contractions.   Objective: Vitals:   08/27/18 0851 08/27/18 1018 08/27/18 1329 08/27/18 1330  BP: 128/73 112/73  (!) 105/53  Pulse: 80 77  91  Resp: 18 18 18    Temp: 98.1 F (36.7 C)  98 F (36.7 C)   TempSrc: Oral  Oral   Weight: (!) 147.5 kg     Height: 5\' 8"  (1.727 m)       FHT:  FHR: 145 bpm, variability: moderate,  accelerations:  Present,  decelerations:  Absent UC:   irregular, every 4-7 minutes SVE:   Dilation: 2.5 Effacement (%): 50 Station: -2 Exam by:: Dominique Calvey, cnm  Labs: Lab Results  Component Value Date   WBC 13.3 (H) 08/27/2018   HGB 12.8 08/27/2018   HCT 36.6 08/27/2018   MCV 83.2 08/27/2018   PLT 214 08/27/2018    Assessment / Plan: Induction of labor due to maternal obesity,  progressing well on pitocin  Labor: Progressing on Pitocin, will continue to increase then AROM Preeclampsia:  no signs or symptoms of toxicity, intake and ouput balanced and labs stable Fetal Wellbeing:  Category I Pain Control:  Labor support without medications I/D:  n/a Anticipated MOD:  NSVD  [name] 08/27/2018, 5:54 PM
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
## 2018-08-27 NOTE — Progress Notes (Signed)
Anne Ray is a 27 y.o. G3P1011 at [redacted]w[redacted]d admitted for induction of labor due to maternal obesity.  Subjective: Patient reports she is comfortable and feeling mild cramping with contractions. Foley came out with gentle traction. Fetal head is not well applied to cervix, will refrain from AROM until patient laboring.   Objective: Vitals:   08/27/18 1834 08/27/18 1923 08/27/18 2005 08/27/18 2111  BP: 130/70 123/65 (!) 126/57 104/63  Pulse: 81 93 68 72  Resp: 18     Temp:    (!) 97.5 F (36.4 C)  TempSrc:    Oral  Weight:      Height:        FHT:  FHR: 145 bpm, variability: moderate,  accelerations:  Present,  decelerations:  Absent UC:   irregular, every 2-4 minutes SVE:  4-5/60/-2  Labs: Lab Results  Component Value Date   WBC 13.3 (H) 08/27/2018   HGB 12.8 08/27/2018   HCT 36.6 08/27/2018   MCV 83.2 08/27/2018   PLT 214 08/27/2018    Assessment / Plan: Induction of labor due to maternal obesity,  progressing well on pitocin  Labor: Progressing on Pitocin, will continue to increase then AROM Preeclampsia:  no signs or symptoms of toxicity, intake and ouput balanced and labs stable Fetal Wellbeing:  Category I Pain Control:  Labor support without medications I/D:  n/a Anticipated MOD:  NSVD  [name] 08/27/2018, 9:30 PM

## 2018-08-27 NOTE — Anesthesia Pain Management Evaluation Note (Signed)
  CRNA Pain Management Visit Note  Patient: Anne Ray, 27 y.o., female  "Hello I am a member of the anesthesia team at [facility]. We have an anesthesia team available at all times to provide care throughout the hospital, including epidural management and anesthesia for C-section. I don't know your plan for the delivery whether it a natural birth, water birth, IV sedation, nitrous supplementation, doula or epidural, but we want to meet your pain goals."   1.Was your pain managed to your expectations on prior hospitalizations?   Yes   2.What is your expectation for pain management during this hospitalization?     Natural   3.How can we help you reach that goal?natural open to Epidural  Record the patient's initial score and the patient's pain goal.   Pain: 0  Pain Goal: 10 [facility] wants you to be able to say your pain was always managed very well.  Rica Records 08/27/2018

## 2018-08-28 ENCOUNTER — Encounter (HOSPITAL_COMMUNITY): Payer: Self-pay

## 2018-08-28 LAB — CREATININE, SERUM
Creatinine, Ser: 0.56 mg/dL (ref 0.44–1.00)
GFR calc non Af Amer: >60 mL/min (ref >60)

## 2018-08-28 LAB — CBC
HEMATOCRIT: 41.3 % (ref 36.0–46.0)
Hemoglobin: 14.4 g/dL (ref 12.0–15.0)
MCH: 29.3 pg (ref 26.0–34.0)
MCHC: 34.9 g/dL (ref 30.0–36.0)
MCV: 83.9 fL (ref 78.0–100.0)
Platelets: 222 10*3/uL (ref 150–400)
RBC: 4.92 MIL/uL (ref 3.87–5.11)
RDW: 14.1 % (ref 11.5–15.5)
WBC: 28.7 10*3/uL — ABNORMAL HIGH (ref 4.0–10.5)

## 2018-08-28 LAB — RPR: RPR Ser Ql: NONREACTIVE

## 2018-08-28 MED ORDER — IBUPROFEN 600 MG PO TABS
600.0000 mg | ORAL_TABLET | Freq: Four times a day (QID) | ORAL | Status: DC
  Administered 2018-08-28 – 2018-08-30 (×9): 600 mg via ORAL
  Filled 2018-08-28 (×9): qty 1

## 2018-08-28 MED ORDER — LACTATED RINGERS IV SOLN: 500.0000 mL | Freq: Once | INTRAVENOUS | Status: DC

## 2018-08-28 MED ORDER — ENOXAPARIN SODIUM 80 MG/0.8ML ~~LOC~~ SOLN
70.0000 mg | SUBCUTANEOUS | Status: DC
  Administered 2018-08-28 – 2018-08-29 (×2): 70 mg via SUBCUTANEOUS
  Filled 2018-08-28 (×3): qty 0.8

## 2018-08-28 MED ORDER — ACETAMINOPHEN 325 MG PO TABS
650.0000 mg | ORAL_TABLET | ORAL | Status: DC | PRN
  Administered 2018-08-28 – 2018-08-29 (×3): 650 mg via ORAL
  Filled 2018-08-28 (×3): qty 2

## 2018-08-28 MED ORDER — EPHEDRINE 5 MG/ML INJ
10.0000 mg | INTRAVENOUS | Status: DC | PRN
  Filled 2018-08-28: qty 2

## 2018-08-28 MED ORDER — ONDANSETRON HCL 4 MG PO TABS: 4.0000 mg | ORAL_TABLET | ORAL | Status: DC | PRN

## 2018-08-28 MED ORDER — SIMETHICONE 80 MG PO CHEW: 80.0000 mg | CHEWABLE_TABLET | ORAL | Status: DC | PRN

## 2018-08-28 MED ORDER — DIPHENHYDRAMINE HCL 25 MG PO CAPS: 25.0000 mg | ORAL_CAPSULE | Freq: Four times a day (QID) | ORAL | Status: DC | PRN

## 2018-08-28 MED ORDER — COCONUT OIL OIL: 1.0000 "application " | TOPICAL_OIL | Status: DC | PRN

## 2018-08-28 MED ORDER — OXYTOCIN 40 UNITS IN LACTATED RINGERS INFUSION - SIMPLE MED
1.0000 m[IU]/min | INTRAVENOUS | Status: DC
  Administered 2018-08-28: 1 m[IU]/min via INTRAVENOUS

## 2018-08-28 MED ORDER — BENZOCAINE-MENTHOL 20-0.5 % EX AERO: 1.0000 "application " | INHALATION_SPRAY | CUTANEOUS | Status: DC | PRN

## 2018-08-28 MED ORDER — TETANUS-DIPHTH-ACELL PERTUSSIS 5-2.5-18.5 LF-MCG/0.5 IM SUSP: 0.5000 mL | Freq: Once | INTRAMUSCULAR | Status: DC

## 2018-08-28 MED ORDER — MEASLES, MUMPS & RUBELLA VAC ~~LOC~~ INJ
0.5000 mL | INJECTION | Freq: Once | SUBCUTANEOUS | Status: DC
  Filled 2018-08-28: qty 0.5

## 2018-08-28 MED ORDER — MEDROXYPROGESTERONE ACETATE 150 MG/ML IM SUSP: 150.0000 mg | INTRAMUSCULAR | Status: DC | PRN

## 2018-08-28 MED ORDER — FENTANYL 2.5 MCG/ML BUPIVACAINE 1/10 % EPIDURAL INFUSION (WH - ANES)
14.0000 mL/h | INTRAMUSCULAR | Status: DC | PRN
  Filled 2018-08-28: qty 100

## 2018-08-28 MED ORDER — SENNOSIDES-DOCUSATE SODIUM 8.6-50 MG PO TABS
2.0000 | ORAL_TABLET | ORAL | Status: DC
  Administered 2018-08-29 (×2): 2 via ORAL
  Filled 2018-08-28 (×2): qty 2

## 2018-08-28 MED ORDER — DIPHENHYDRAMINE HCL 50 MG/ML IJ SOLN: 12.5000 mg | INTRAMUSCULAR | Status: DC | PRN

## 2018-08-28 MED ORDER — WITCH HAZEL-GLYCERIN EX PADS: 1.0000 "application " | MEDICATED_PAD | CUTANEOUS | Status: DC | PRN

## 2018-08-28 MED ORDER — PRENATAL MULTIVITAMIN CH
1.0000 | ORAL_TABLET | Freq: Every day | ORAL | Status: DC
  Administered 2018-08-29 – 2018-08-30 (×2): 1 via ORAL
  Filled 2018-08-28 (×2): qty 1

## 2018-08-28 MED ORDER — ZOLPIDEM TARTRATE 5 MG PO TABS: 5.0000 mg | ORAL_TABLET | Freq: Every evening | ORAL | Status: DC | PRN

## 2018-08-28 MED ORDER — PHENYLEPHRINE 40 MCG/ML (10ML) SYRINGE FOR IV PUSH (FOR BLOOD PRESSURE SUPPORT)
80.0000 ug | PREFILLED_SYRINGE | INTRAVENOUS | Status: DC | PRN
  Filled 2018-08-28: qty 5

## 2018-08-28 MED ORDER — ONDANSETRON HCL 4 MG/2ML IJ SOLN: 4.0000 mg | INTRAMUSCULAR | Status: DC | PRN

## 2018-08-28 MED ORDER — DIBUCAINE 1 % RE OINT: 1.0000 "application " | TOPICAL_OINTMENT | RECTAL | Status: DC | PRN

## 2018-08-28 NOTE — Progress Notes (Signed)
Labor Progress Note  Anne Ray, 27 y.o., [phone], with an IUP @ [redacted]w[redacted]d, presenting for induction of labor at 39 weeks for maternal obesity.  Subjective: Pt resting in bed in stable condition with partner at bedside. Pt feeling cxt, but able to tolerate them well and wishes to just use IV pain meds for now. Pt denies leakage of fluids, vaginal bleeding and endorse +FM.  Patient Active Problem List   Diagnosis Date Noted  . Chlamydial infection 04/24/2018  . Maternal obesity affecting pregnancy, antepartum 02/19/2018  . Chronic headache disorder 02/19/2018  . Pregnant 01/06/2018  . Vaginal odor 12/13/2017  . Acute vaginitis 12/13/2017  . Healthcare maintenance 12/05/2017  . Family history of diabetes mellitus in mother 12/05/2017  . Fatigue 12/05/2017  . Morbid obesity (HCC) 12/05/2017  . Migraine 12/05/2017  . Liver cyst 12/05/2017  . RhD negative 05/16/2017  . Sickle cell trait (HCC) 05/16/2017  . Infertility, female 05/24/2016  . Obesity 05/01/2016  . Pyelonephritis 04/30/2016  . RUQ abdominal pain    Objective: BP (!) 108/58   Pulse 74   Temp 98.5 F (36.9 C) (Oral)   Resp 18   Ht 5\' 8"  (1.727 m)   Wt (!) 147.5 kg   LMP 03/28/2017   SpO2 100%   BMI 49.45 kg/m  No intake/output data recorded. No intake/output data recorded. NST: FHR baseline 140 bpm, Variability: moderate, Accelerations:present, Decelerations:  Absent= Cat 1/Reactive for the last hour.  CTX:  regular, every 2 minutes, lasting 60/80 seconds Uterus gravid, soft non tender, moderate to palpate with contractions.  SVE:  Dilation: 5 Effacement (%): 80 Station: -2 Exam by:: [place], CNM Pitocin at  6 mUn/min  Pt educated on risk and benefits about SROM and IUPC as well as FSE placement. Pt denies any questions and verbally consented to all three as Needed.   SROM with light mec, fetal and maternal toleraed procedure well,   IUPC placed and was tolerated well . Post placement MVU were  180  Assessment:  Anne Ray, 27 y.o., G3P1011, with an IUP @ [redacted]w[redacted]d,presenting for induction of labor at 39 weeks for maternal obesity (BMI 50). Pt comfortable in bed breathing through contractions, would like to continue with pain meds PRN, SROM with IUPC placed and tolerated.  Patient Active Problem List   Diagnosis Date Noted  . Chlamydial infection 04/24/2018  . Maternal obesity affecting pregnancy, antepartum 02/19/2018  . Chronic headache disorder 02/19/2018  . Pregnant 01/06/2018  . Vaginal odor 12/13/2017  . Acute vaginitis 12/13/2017  . Healthcare maintenance 12/05/2017  . Family history of diabetes mellitus in mother 12/05/2017  . Fatigue 12/05/2017  . Morbid obesity (HCC) 12/05/2017  . Migraine 12/05/2017  . Liver cyst 12/05/2017  . RhD negative 05/16/2017  . Sickle cell trait (HCC) 05/16/2017  . Infertility, female 05/24/2016  . Obesity 05/01/2016  . Pyelonephritis 04/30/2016  . RUQ abdominal pain    NICHD: Category 1  Membranes:  SROM now light meconium x 0 hrs, no s/s of infection  MVUs 180  Induction:    Cytotec x2 on  09/29 @ 0923 & 1330  Foley Bulb: reported in on 09/29, out around 9pm  Pitocin - 6  Pain management:               IV pain management: x 1 on 09/29 @ 2328  Nitrous: None             Epidural placement:  Right now does not want  GBS Negative  Blood Type: O-   Plan: Continue labor plan Continuous monitoring Rest/Ambulate/Frequent position changes with peanut ball to facilitate fetal rotation and descent. Will reassess with cervical exam at 1230 or earlier if necessary Continue pitocin per protocol and titrate to MVUs of 200 Amnioinfusion with bolus of 300 cc, then 150 cc/hr if indicated with variable decels.  RN to place FSE if Cat 2 strip noted.  Continue with IV pain meds for now. Pain manage,ment reviewed with pt and pt verbalized understanding.  Anticipate labor progression and vaginal delivery.  Rhogam if indicated for  O- blood type.   Md Dillard aware of plan and verbalized agreement.   Dale Osseo, NP-C, CNM, MSN 08/28/2018. 9:28 AM

## 2018-08-28 NOTE — Progress Notes (Addendum)
TYAIRA HEWARD is a 27 y.o. G3P1011 at [redacted]w[redacted]d admitted for induction of labor due to maternal obesity.  Subjective: Called by RN to room as patient had a run of repeated moderately deep late decels after returning from the bathroom. RN discontinued pitocin, gave fluid bolus, and repositioned patient prior to calling me and pattern had already improved. Patient did have a few mildly lates after Fentanyl administration as well but these improved spontaneously.   Plan to take a break to give baby time to recover, after 1 hour of category 1 strip will consider restarting pitocin.  Objective: Vitals:   08/27/18 2332 08/27/18 2337 08/28/18 0001 08/28/18 0048  BP: 119/73 117/68 123/62 (!) 112/54  Pulse: 92 91 90 73  Resp:    18  Temp:    98.1 F (36.7 C)  TempSrc:    Oral  Weight:      Height:        FHT:  FHR: 145 bpm, variability: moderate,  accelerations:  Abscent,  decelerations:  Absent. Category II now category I.  UC:   irregular, every 2-4 minutes SVE:  4-5/60/-2  Labs: Lab Results  Component Value Date   WBC 13.3 (H) 08/27/2018   HGB 12.8 08/27/2018   HCT 36.6 08/27/2018   MCV 83.2 08/27/2018   PLT 214 08/27/2018    Assessment / Plan: Induction of labor due to maternal obesity,  progressing well on pitocin  Labor: Progressing on Pitocin, will continue to increase then AROM Preeclampsia:  no signs or symptoms of toxicity, intake and ouput balanced and labs stable Fetal Wellbeing:  Category I Pain Control:  Labor support without medications I/D:  n/a Anticipated MOD:  NSVD  [name] 08/28/2018, 1:29 AM

## 2018-08-28 NOTE — Progress Notes (Signed)
Labor Progress Note  Anne Ray, 27 y.o., [phone], with an IUP @ [redacted]w[redacted]d, presenting for induction of labor at 39 weeks for maternal obesity.  Subjective: Pt asleep in bed on right side with peanut ball, family at bedside. RN reports early decelerations that return to baseline post cxt. Maternal position changed was used. Pt still not wanting epidural and just received another dose of IV pain meds.  Patient Active Problem List   Diagnosis Date Noted  . Chlamydial infection 04/24/2018  . Maternal obesity affecting pregnancy, antepartum 02/19/2018  . Chronic headache disorder 02/19/2018  . Pregnant 01/06/2018  . Vaginal odor 12/13/2017  . Acute vaginitis 12/13/2017  . Healthcare maintenance 12/05/2017  . Family history of diabetes mellitus in mother 12/05/2017  . Fatigue 12/05/2017  . Morbid obesity (HCC) 12/05/2017  . Migraine 12/05/2017  . Liver cyst 12/05/2017  . RhD negative 05/16/2017  . Sickle cell trait (HCC) 05/16/2017  . Infertility, female 05/24/2016  . Obesity 05/01/2016  . Pyelonephritis 04/30/2016  . RUQ abdominal pain    Objective: BP 115/75   Pulse 66   Temp 97.8 F (36.6 C) (Oral)   Resp 18   Ht 5\' 8"  (1.727 m)   Wt (!) 147.5 kg   LMP 03/28/2017   SpO2 100%   BMI 49.45 kg/m  No intake/output data recorded. No intake/output data recorded. NST: FHR baseline 135 bpm, Variability: moderate, Accelerations:present, Decelerations:  Early decelerations noted= Cat 2/Reactive for the last hour.  CTX:  regular, every 2 minutes, lasting 80-100 seconds Uterus gravid, soft non tender, moderate to palpate with contractions.  SVE:  Dilation: 8 Effacement (%): 90 Station: -1 Exam by:: Jeanett Schlein, RN Pitocin at  6 mUn/min  IUPC still in place with MVU of 150-180  Assessment:  Anne Ray, 27 y.o., G3P1011, with an IUP @ [redacted]w[redacted]d,presenting for induction of labor at 39 weeks for maternal obesity (BMI 50). Pt asleep in bed post being given fentanyl for  pain control.  Patient Active Problem List   Diagnosis Date Noted  . Chlamydial infection 04/24/2018  . Maternal obesity affecting pregnancy, antepartum 02/19/2018  . Chronic headache disorder 02/19/2018  . Pregnant 01/06/2018  . Vaginal odor 12/13/2017  . Acute vaginitis 12/13/2017  . Healthcare maintenance 12/05/2017  . Family history of diabetes mellitus in mother 12/05/2017  . Fatigue 12/05/2017  . Morbid obesity (HCC) 12/05/2017  . Migraine 12/05/2017  . Liver cyst 12/05/2017  . RhD negative 05/16/2017  . Sickle cell trait (HCC) 05/16/2017  . Infertility, female 05/24/2016  . Obesity 05/01/2016  . Pyelonephritis 04/30/2016  . RUQ abdominal pain    NICHD: Category 2 with resolution with position change.   Membranes:  SROM now light meconium x 4 hrs, no s/s of infection  MVUs 150-180  Induction:    Cytotec x2 on  09/29 @ 0923 & 1330  Foley Bulb: reported in on 09/29, out around 9pm  Pitocin - 6  Pain management:               IV pain management: x 1 on 09/29 @ 2328  Nitrous: None             Epidural placement:  Right now does not want   GBS Negative  Blood Type: O-   Plan: Continue labor plan Continuous monitoring Rest/Ambulate/Frequent position changes with peanut ball to facilitate fetal rotation and descent. Will reassess with cervical exam if necessary by maternal or fetal indications.  Leave pitocin where it is at  for now at 6, due to pt making cervical change.  Amnioinfusion with bolus of 300 cc, then 150 cc/hr if indicated with variable decels.   Continue with IV pain meds for now. Pain management reviewed with pt and pt verbalized understanding.  Anticipate labor progression and vaginal delivery.  Rhogam if indicated for O- blood type.   Md Dillard aware of plan and verbalized agreement.   Dale Benson, NP-C, CNM, MSN 08/28/2018. 11:51 AM

## 2018-08-28 NOTE — Lactation Note (Signed)
This note was copied from a baby's chart. Lactation Consultation Note  Patient Name: Anne Ray [name] Date: 08/28/2018 Reason for consult: Initial assessment;Term;1st time breastfeeding  9 hours old FT female who is being exclusively BF by his mother, she's a P2. Mom came as BR/FO so she may supplement at some point. She didn't BF her first child, only tried while at the hospital but it didn't work out, mom said it was too hard. She participated in the [facility] program at the [facility]; doesn't have a pump at home; [facility] offered a hand pump from the hospital. Pump instructions, cleaning and storage were reviewed as well as milk storage guidelines.   LC showed mom how to hand express prior latching; LC offered assistance with latch and mom agreed to have baby STS, after a few attempts in cross cradle position, baby took the left breast and was able to sustain the latch for 16 minutes. Only a few swallows were heard when doing breast compressions. Cluster feeding was discussed.  Feeding plan  1. Encouraged mom to feed baby STS 8-12 times/24 hours or sooner if feeding cue are present 2. Hand expression and finger/spoon feeding was encouraged  BF brochure, BF resources and feeding diary were reviewed, both parents aware of LC services and will call PRN.  Maternal Data Formula Feeding for Exclusion: Yes Reason for exclusion: Mother's choice to formula and breast feed on admission Has patient been taught Hand Expression?: Yes Does the patient have breastfeeding experience prior to this delivery?: No(She BF her first child only at the hospital but that was 13 years ago)  Feeding Feeding Type: Breast Fed Length of feed: 16 min  LATCH Score Latch: Repeated attempts needed to sustain latch, nipple held in mouth throughout feeding, stimulation needed to elicit sucking reflex.  Audible Swallowing: A few with stimulation(with breast compressions)  Type of Nipple: Everted at rest and after  stimulation(short shafted, but very compressible)  Comfort (Breast/Nipple): Soft / non-tender  Hold (Positioning): Assistance needed to correctly position infant at breast and maintain latch.  LATCH Score: 7  Interventions Interventions: Breast feeding basics reviewed;Assisted with latch;Skin to skin;Breast massage;Hand express;Breast compression;Adjust position;Hand pump;Support pillows  Lactation Tools Discussed/Used Tools: Pump Breast pump type: Manual WIC Program: Yes Pump Review: Setup, frequency, and cleaning;Milk Storage Initiated by:: MPeck Date initiated:: 08/28/18   Consult Status Consult Status: Follow-up Date: 08/29/18 Follow-up type: In-patient    Laria Grimmett Venetia Constable 08/28/2018, 10:20 PM

## 2018-08-28 NOTE — Progress Notes (Signed)
Anne Ray is a 27 y.o. G3P1011 at [redacted]w[redacted]d admitted for induction of labor due to maternal obesity.  Subjective: Patient asleep. Contractions are mild.   Objective: Vitals:   08/28/18 0300 08/28/18 0330 08/28/18 0400 08/28/18 0602  BP: (!) 122/48 111/66 (!) 103/43 (!) 125/58  Pulse: 70 71 71 78  Resp:      Temp: 98.3 F (36.8 C)     TempSrc: Oral     SpO2:      Weight:      Height:        FHT:  FHR: 145 bpm, variability: moderate,  accelerations:  Present,  decelerations:  Absent. UC:   irregular, every 2-3 minutes SVE:  4-5/60/-2  Labs: Lab Results  Component Value Date   WBC 13.3 (H) 08/27/2018   HGB 12.8 08/27/2018   HCT 36.6 08/27/2018   MCV 83.2 08/27/2018   PLT 214 08/27/2018    Assessment / Plan: Induction of labor due to maternal obesity,  progressing well on pitocin  Labor: Progressing on Pitocin, will continue to increase then AROM Preeclampsia:  no signs or symptoms of toxicity, intake and ouput balanced and labs stable Fetal Wellbeing:  Category I Pain Control:  Labor support without medications I/D:  n/a Anticipated MOD:  NSVD  [name] 08/28/2018, 6:44 AM

## 2018-08-29 LAB — CBC
HEMATOCRIT: 34.2 % — AB (ref 36.0–46.0)
Hemoglobin: 11.9 g/dL — ABNORMAL LOW (ref 12.0–15.0)
MCH: 29 pg (ref 26.0–34.0)
MCHC: 34.8 g/dL (ref 30.0–36.0)
MCV: 83.2 fL (ref 78.0–100.0)
PLATELETS: 193 10*3/uL (ref 150–400)
RBC: 4.11 MIL/uL (ref 3.87–5.11)
RDW: 14.1 % (ref 11.5–15.5)
WBC: 19.9 10*3/uL — AB (ref 4.0–10.5)

## 2018-08-29 NOTE — Progress Notes (Addendum)
Subjective: Postpartum Day 1: Vaginal delivery, no laceration Patient up ad lib, reports no syncope or dizziness. Feeding:  Breastfeeding at present, may consider supplementation after d/c. Contraceptive plan:  Considering Mirena  On Lovenox during pp hospitalization due to BMI.  Objective: Vital signs in last 24 hours: Temp:  [97.8 F (36.6 C)-98.7 F (37.1 C)] 98.7 F (37.1 C) (10/01 0500) Pulse Rate:  [66-239] 72 (10/01 0500) Resp:  [18-20] 18 (10/01 0500) BP: (105-134)/(51-103) 105/56 (10/01 0500) SpO2:  [96 %-100 %] 97 % (10/01 0500)   Vitals:   08/28/18 1637 08/28/18 2058 08/28/18 2300 08/29/18 0500  BP: 120/61 116/60 117/62 (!) 105/56  Pulse: 79 88 81 72  Resp: 20 18 18 18   Temp: 98.2 F (36.8 C) 98.4 F (36.9 C) 98.2 F (36.8 C) 98.7 F (37.1 C)  TempSrc: Oral Oral Oral Oral  SpO2:  100% 96% 97%  Weight:      Height:        Physical Exam:  General: dozing, arousable Lochia: appropriate Uterine Fundus: firm Perineum: Intact DVT Evaluation: No evidence of DVT seen on physical exam. Negative Homan's sign.   CBC Latest Ref Rng & Units 08/29/2018 08/28/2018 08/27/2018  WBC 4.0 - 10.5 K/uL 19.9(H) 28.7(H) 13.3(H)  Hemoglobin 12.0 - 15.0 g/dL 11.9(L) 14.4 12.8  Hematocrit 36.0 - 46.0 % 34.2(L) 41.3 36.6  Platelets 150 - 400 K/uL 193 222 214     Assessment/Plan: Status post vaginal delivery day 1. Stable Plans outpatient circumcision. Continue current care. Plan for discharge tomorrow    Nigel Bridgeman CNM 08/29/2018, 8:01 AM

## 2018-08-30 ENCOUNTER — Encounter (HOSPITAL_COMMUNITY): Payer: Self-pay | Admitting: *Deleted

## 2018-08-30 MED ORDER — IBUPROFEN 600 MG PO TABS: 600.0000 mg | ORAL_TABLET | Freq: Four times a day (QID) | ORAL | 0 refills | Status: DC

## 2018-08-30 NOTE — Discharge Summary (Signed)
OB Discharge Summary     Patient Name: Anne Ray DOB: 30-Nov-1990 MRN: [id number]  Date of admission: 08/27/2018 Delivering MD: Sundra Aland   Date of discharge: 08/30/2018  Admitting diagnosis: induction  Intrauterine pregnancy: 104w3d     Secondary diagnosis:  Principal Problem:   Vaginal delivery Active Problems:   Obesity   Sickle cell trait (HCC)   Maternal obesity affecting pregnancy, antepartum  Additional problems: None     Discharge diagnosis: Term Pregnancy Delivered                                                                                                Post partum procedures:None  Augmentation: AROM, Pitocin, Cytotec and Foley Balloon  Complications: None  Hospital course:  Induction of Labor With Vaginal Delivery   27 y.o. yo G3P1011 at [redacted]w[redacted]d was admitted to the hospital 08/27/2018 for induction of labor.  Indication for induction: Maternal obesity.  Patient had an uncomplicated labor course as follows: Membrane Rupture Time/Date: 9:36 AM ,08/28/2018   Intrapartum Procedures: Episiotomy: None [1]                                         Lacerations:  None [1]  Patient had delivery of a Viable infant.  Information for the patient's newborn:  Diondra, Pines [[id number]]  Delivery Method: Vaginal, Spontaneous(Filed from Delivery Summary)   08/28/2018  Details of delivery can be found in separate delivery note.  Patient had a routine postpartum course. Patient is discharged home 08/30/18.  Physical exam  Vitals:   08/29/18 0500 08/29/18 1425 08/29/18 2257 08/30/18 0541  BP: (!) 105/56 118/73 135/73 113/72  Pulse: 72 79 81 65  Resp: 18 18 18 18   Temp: 98.7 F (37.1 C) 98.1 F (36.7 C) 98.4 F (36.9 C) 98.2 F (36.8 C)  TempSrc: Oral Oral Oral Oral  SpO2: 97%   100%  Weight:      Height:       General: alert, cooperative and no distress Lochia: appropriate Uterine Fundus: firm Incision: N/A DVT Evaluation: No evidence of DVT  seen on physical exam. Labs: Lab Results  Component Value Date   WBC 19.9 (H) 08/29/2018   HGB 11.9 (L) 08/29/2018   HCT 34.2 (L) 08/29/2018   MCV 83.2 08/29/2018   PLT 193 08/29/2018   CMP Latest Ref Rng & Units 08/28/2018  Glucose 65 - 99 mg/dL -  BUN 6 - 20 mg/dL -  Creatinine 8.65 - 7.84 mg/dL 6.96  Sodium 295 - 284 mmol/L -  Potassium 3.5 - 5.2 mmol/L -  Chloride 96 - 106 mmol/L -  CO2 20 - 29 mmol/L -  Calcium 8.7 - 10.2 mg/dL -  Total Protein 6.0 - 8.5 g/dL -  Total Bilirubin 0.0 - 1.2 mg/dL -  Alkaline Phos 39 - 132 IU/L -  AST 0 - 40 IU/L -  ALT 0 - 32 IU/L -    Discharge instruction: per After Visit Summary and "Baby and  Me Booklet".  After visit meds:  Allergies as of 08/30/2018      Reactions   Food Itching, Swelling, Other (See Comments)   Pt is allergic to kiwi.   Reaction:  Tongue swelling and itching   Tramadol Hives, Nausea And Vomiting      Medication List    STOP taking these medications   acetaminophen 325 MG tablet Commonly known as:  TYLENOL     TAKE these medications   ibuprofen 600 MG tablet Commonly known as:  ADVIL,MOTRIN Take 1 tablet (600 mg total) by mouth every 6 (six) hours.   PRENATE PIXIE 10-0.6-0.4-200 MG Caps Take 1 tablet by mouth daily.   Vitamin D (Ergocalciferol) 50000 units Caps capsule Commonly known as:  DRISDOL Take 1 capsule (50,000 Units total) by mouth every 7 (seven) days.       Diet: routine diet  Activity: Advance as tolerated. Pelvic rest for 6 weeks.   Outpatient follow up:6 weeks Follow up Appt: Future Appointments  Date Time Provider Department Center  01/08/2019  8:15 AM Danford, Jinny Blossom, NP [facility] None   Follow up Visit:No follow-ups on file.  Postpartum contraception: Depo Provera  Newborn Data: Live born female  Birth Weight: 6 lb 7.9 oz (2946 g) APGAR: 9, 9  Newborn Delivery   Birth date/time:  08/28/2018 13:09:00 Delivery type:  Vaginal, Spontaneous     Baby Feeding:  Breast Disposition:home with mother   08/30/2018 Kenney Houseman, CNM

## 2018-08-30 NOTE — Lactation Note (Addendum)
This note was copied from a baby's chart. Lactation Consultation Note  Patient Name: Anne Ray [name] Date: 08/30/2018 Reason for consult: Follow-up assessment;Term;1st time breastfeeding P1, 37 hr female infant , term LC entered room, mom was Bf infant on her left breast in a side lying position, infant latched with wide gape and actively suckling at breast w/ swallows heard. No assistance was needed with latch.  Mom had been BF 20 minutes prior to  [facility] entering room and  mom was still BF as LC left room. Per mom,  infant recently been BF 25 to 30 minutes most feeds now. Mom feels BF is going well.  LC discussed engorgement prevention and treatment. Mom made aware of O/P services, breastfeeding support groups, community resources, and our phone # for post-discharge questions.   Maternal Data    Feeding Feeding Type: Breast Fed Length of feed: 5 min  LATCH Score Latch: Grasps breast easily, tongue down, lips flanged, rhythmical sucking.  Audible Swallowing: Spontaneous and intermittent  Type of Nipple: Everted at rest and after stimulation  Comfort (Breast/Nipple): Soft / non-tender  Hold (Positioning): No assistance needed to correctly position infant at breast.  LATCH Score: 10  Interventions    Lactation Tools Discussed/Used     Consult Status Consult Status: Follow-up Date: 08/31/18 Follow-up type: In-patient    Danelle Earthly 08/30/2018, 2:36 AM

## 2018-08-31 LAB — TYPE AND SCREEN
ABO/RH(D): O NEG
Antibody Screen: POSITIVE
UNIT DIVISION: 0
UNIT DIVISION: 0

## 2018-08-31 LAB — BPAM RBC
Blood Product Expiration Date: 201911012359
Blood Product Expiration Date: 201911032359
UNIT TYPE AND RH: 9500
Unit Type and Rh: 9500

## 2018-09-28 ENCOUNTER — Encounter (HOSPITAL_COMMUNITY): Payer: Self-pay
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
## 2019-01-08 ENCOUNTER — Encounter: Payer: 59 | Admitting: Adult Health

## 2019-05-03 ENCOUNTER — Other Ambulatory Visit: Payer: Self-pay

## 2019-05-03 ENCOUNTER — Encounter (HOSPITAL_COMMUNITY): Payer: Self-pay | Admitting: Emergency Medicine

## 2019-05-03 ENCOUNTER — Ambulatory Visit (HOSPITAL_COMMUNITY)
Admission: EM | Admit: 2019-05-03 | Discharge: 2019-05-03 | Disposition: A | Discharge status: Home / Self Care | Payer: 59 | Attending: Urgent Care | Admitting: Urgent Care

## 2019-05-03 DIAGNOSIS — M79645 Pain in left finger(s): Secondary | ICD-10-CM

## 2019-05-03 DIAGNOSIS — S60042A Contusion of left ring finger without damage to nail, initial encounter: Secondary | ICD-10-CM

## 2019-05-03 MED ORDER — CEPHALEXIN 500 MG PO CAPS: 500.0000 mg | ORAL_CAPSULE | Freq: Two times a day (BID) | ORAL | 0 refills | Status: DC

## 2019-05-03 MED ORDER — NAPROXEN 500 MG PO TABS: 500.0000 mg | ORAL_TABLET | Freq: Every day | ORAL | 0 refills | Status: DC | PRN

## 2019-05-03 NOTE — ED Triage Notes (Signed)
Pt here for pain in left finger with discoloration to area
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
## 2019-05-14 ENCOUNTER — Encounter: Payer: Self-pay | Admitting: Adult Health

## 2019-05-14 ENCOUNTER — Other Ambulatory Visit: Payer: Self-pay

## 2019-05-14 ENCOUNTER — Ambulatory Visit (INDEPENDENT_AMBULATORY_CARE_PROVIDER_SITE_OTHER): Payer: 59 | Admitting: Adult Health

## 2019-05-14 VITALS — BP 124/72 | HR 79 | Temp 99.3°F | Ht 69.0 in | Wt 330.4 lb

## 2019-05-14 DIAGNOSIS — L089 Local infection of the skin and subcutaneous tissue, unspecified: Secondary | ICD-10-CM | POA: Diagnosis not present

## 2019-05-14 DIAGNOSIS — Z0000 Encounter for general adult medical examination without abnormal findings: Secondary | ICD-10-CM

## 2019-05-14 DIAGNOSIS — M79645 Pain in left finger(s): Secondary | ICD-10-CM | POA: Diagnosis not present
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
## 2019-05-14 NOTE — Assessment & Plan Note (Signed)
We will call you when the lab and xray results are available. Please follow care instructions as directed above. Possible referral to hand specialist.

## 2019-05-14 NOTE — Assessment & Plan Note (Signed)
Continue to social distance and wear a mask when out in public. 

## 2019-05-14 NOTE — Patient Instructions (Signed)
Hand Contusion A hand contusion is a deep bruise to the hand. Contusions are the result of a blunt injury to tissues and muscle fibers under the skin. The injury causes bleeding under the skin. The skin overlying the contusion may turn blue, purple, or yellow. Minor injuries will give you a painless contusion, but more severe contusions may stay painful and swollen for a few weeks. What are the causes? A contusion is usually caused by a hard hit, trauma, or direct force to your hand, such as having a heavy object fall on your hand. What are the signs or symptoms? Symptoms of this condition include:  Swelling of the hand.  Pain and tenderness of the hand.  Discoloration of the hand. The area may have redness and then turn blue, purple, or yellow. How is this diagnosed? This condition is diagnosed from a physical exam and your medical history. An X-ray may be needed to see if there are any other injuries, such as broken bones (fractures). Sometimes, a CT scan or MRI may be needed if your health care provider is concerned that you may have torn or injured ligaments. How is this treated? An elastic wrap may be recommended to support your hand. In general, the best treatment for a hand contusion is rest, ice, pressure (compression), and elevation of the injured area. This is often called RICE therapy. Over-the-counter medicines may also be recommended for pain control. Follow these instructions at home: [place] the injured area.  If directed, apply ice to the injured area: ? Put ice in a plastic bag. ? Place a towel between your skin and the bag. ? Leave the ice on for 20 minutes, 2-3 times a day.  If directed, apply light compression to the injured area using an elastic wrap. Make sure the wrap is not too tight. Remove and reapply the wrap as told by your health care provider. If your fingers become numb, cold, or blue, take the wrap off and reapply it more loosely.  Raise  (elevate) the injured area above the level of your heart while you are sitting or lying down. General instructions   Take over-the-counter and prescription medicines only as told by your health care provider.  Protect your hand from getting injured further.  Keep all follow-up visits as told by your health care provider. This is important. Contact a health care provider if:  Your symptoms do not improve after several days of treatment.  You have increased redness, swelling, or pain in your hand or fingers.  You have difficulty moving the injured area.  Your swelling or pain is not relieved with medicines. Get help right away if:  You have severe pain.  Your hand or fingers become numb.  Your hand or fingers turn pale, blue, or cold.  You cannot move your hand or wrist.  Your hand is warm to the touch. This information is not intended to replace advice given to you by your health care provider. Make sure you discuss any questions you have with your health care provider. Document Released: 05/07/2002 Document Revised: 07/09/2016 Document Reviewed: 09/24/2015 Elsevier Interactive Patient Education  2019 [employer].  We will call you when the lab and xray results are available. Please follow care instructions as directed above. Continue to social distance and wear a mask when out in public. CONGRATULATIONS ON YOUR SON! FEEL BETTER!

## 2019-05-15 ENCOUNTER — Ambulatory Visit (INDEPENDENT_AMBULATORY_CARE_PROVIDER_SITE_OTHER): Payer: 59 | Admitting: Adult Health

## 2019-05-15 ENCOUNTER — Ambulatory Visit: Payer: 59

## 2019-05-15 DIAGNOSIS — L089 Local infection of the skin and subcutaneous tissue, unspecified: Secondary | ICD-10-CM

## 2019-05-15 DIAGNOSIS — M79645 Pain in left finger(s): Secondary | ICD-10-CM

## 2019-05-15 LAB — CBC WITH DIFFERENTIAL/PLATELET
Basophils Absolute: 0.1 10*3/uL (ref 0.0–0.2)
Basos: 1 %
EOS (ABSOLUTE): 0.2 10*3/uL (ref 0.0–0.4)
Eos: 2 %
Hematocrit: 42.7 % (ref 34.0–46.6)
Hemoglobin: 14.4 g/dL (ref 11.1–15.9)
Immature Grans (Abs): 0 10*3/uL (ref 0.0–0.1)
Immature Granulocytes: 0 %
Lymphocytes Absolute: 3.5 10*3/uL — ABNORMAL HIGH (ref 0.7–3.1)
Lymphs: 31 %
MCH: 28.2 pg (ref 26.6–33.0)
MCHC: 33.7 g/dL (ref 31.5–35.7)
MCV: 84 fL (ref 79–97)
Monocytes Absolute: 0.9 10*3/uL (ref 0.1–0.9)
Monocytes: 8 %
Neutrophils Absolute: 6.4 10*3/uL (ref 1.4–7.0)
Neutrophils: 58 %
Platelets: 277 10*3/uL (ref 150–450)
RBC: 5.11 x10E6/uL (ref 3.77–5.28)
RDW: 13.5 % (ref 11.7–15.4)
WBC: 11.1 10*3/uL — ABNORMAL HIGH (ref 3.4–10.8)

## 2019-05-15 NOTE — Progress Notes (Signed)
Patient is here for xray on hand. MPulliam, CMA/RT(R)

## 2019-05-16 ENCOUNTER — Other Ambulatory Visit: Payer: Self-pay | Admitting: Adult Health

## 2019-05-16 MED ORDER — CLINDAMYCIN HCL 300 MG PO CAPS: 300.0000 mg | ORAL_CAPSULE | Freq: Three times a day (TID) | ORAL | 0 refills | Status: DC

## 2020-07-01 ENCOUNTER — Other Ambulatory Visit: Payer: Self-pay | Admitting: Obstetrics & Gynecology

## 2021-05-14 ENCOUNTER — Encounter (HOSPITAL_COMMUNITY): Payer: Self-pay

## 2021-05-14 ENCOUNTER — Other Ambulatory Visit: Payer: Self-pay

## 2021-05-14 ENCOUNTER — Emergency Department (HOSPITAL_COMMUNITY): Payer: 59

## 2021-05-14 ENCOUNTER — Emergency Department (HOSPITAL_COMMUNITY)
Admission: EM | Admit: 2021-05-14 | Discharge: 2021-05-14 | Disposition: A | Discharge status: Home / Self Care | Payer: 59 | Attending: Emergency Medicine | Admitting: Emergency Medicine

## 2021-05-14 DIAGNOSIS — Y9289 Other specified places as the place of occurrence of the external cause: Secondary | ICD-10-CM | POA: Diagnosis not present

## 2021-05-14 DIAGNOSIS — M25571 Pain in right ankle and joints of right foot: Secondary | ICD-10-CM | POA: Insufficient documentation

## 2021-05-14 DIAGNOSIS — W010XXA Fall on same level from slipping, tripping and stumbling without subsequent striking against object, initial encounter: Secondary | ICD-10-CM | POA: Diagnosis not present

## 2021-05-14 DIAGNOSIS — W19XXXA Unspecified fall, initial encounter: Secondary | ICD-10-CM

## 2021-05-14 MED ORDER — IBUPROFEN 200 MG PO TABS
600.0000 mg | ORAL_TABLET | Freq: Once | ORAL | Status: AC
  Administered 2021-05-14: 600 mg via ORAL
  Filled 2021-05-14: qty 3

## 2021-05-14 NOTE — Discharge Instructions (Addendum)
Imaging looks reassuring.  I placed you in  a postop boot please wear during the day you may take off at nighttime.  Given you crutches please remain nonweightbearing.  I recommend over-the-counter pain medication like ibuprofen and or Tylenol please follow dosing on the back of bottle.  Please keep the area elevated while not use and apply ice to the areas to help decrease inflammation and swelling.  Please follow-up with orthopedic surgery for further evaluation.  Come back to the emergency department if you develop chest pain, shortness of breath, severe abdominal pain, uncontrolled nausea, vomiting, diarrhea.
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
## 2022-05-21 ENCOUNTER — Emergency Department (HOSPITAL_BASED_OUTPATIENT_CLINIC_OR_DEPARTMENT_OTHER)
Admission: EM | Admit: 2022-05-21 | Discharge: 2022-05-21 | Disposition: A | Discharge status: Home / Self Care | Payer: 59 | Attending: Emergency Medicine | Admitting: Emergency Medicine

## 2022-05-21 ENCOUNTER — Encounter (HOSPITAL_BASED_OUTPATIENT_CLINIC_OR_DEPARTMENT_OTHER): Payer: Self-pay

## 2022-05-21 ENCOUNTER — Other Ambulatory Visit: Payer: Self-pay

## 2022-05-21 DIAGNOSIS — Y990 Civilian activity done for income or pay: Secondary | ICD-10-CM | POA: Diagnosis not present

## 2022-05-21 DIAGNOSIS — M5441 Lumbago with sciatica, right side: Secondary | ICD-10-CM

## 2022-05-21 DIAGNOSIS — X501XXA Overexertion from prolonged static or awkward postures, initial encounter: Secondary | ICD-10-CM | POA: Insufficient documentation

## 2022-05-21 DIAGNOSIS — M545 Low back pain, unspecified: Secondary | ICD-10-CM | POA: Diagnosis present

## 2022-05-21 MED ORDER — LIDOCAINE 5 % EX PTCH
1.0000 | MEDICATED_PATCH | CUTANEOUS | Status: DC
  Administered 2022-05-21: 1 via TRANSDERMAL
  Filled 2022-05-21: qty 1

## 2022-05-21 MED ORDER — CYCLOBENZAPRINE HCL 10 MG PO TABS: 10.0000 mg | ORAL_TABLET | Freq: Two times a day (BID) | ORAL | 0 refills | Status: DC | PRN

## 2022-05-21 MED ORDER — KETOROLAC TROMETHAMINE 30 MG/ML IJ SOLN
30.0000 mg | Freq: Once | INTRAMUSCULAR | Status: AC
  Administered 2022-05-21: 30 mg via INTRAMUSCULAR
  Filled 2022-05-21: qty 1

## 2022-06-10 ENCOUNTER — Ambulatory Visit: Payer: 59 | Admitting: Nurse Practitioner

## 2022-08-05 ENCOUNTER — Other Ambulatory Visit: Payer: Self-pay | Admitting: Obstetrics & Gynecology

## 2022-08-05 DIAGNOSIS — Z793 Long term (current) use of hormonal contraceptives: Secondary | ICD-10-CM

## 2022-08-26 ENCOUNTER — Ambulatory Visit
Admission: RE | Admit: 2022-08-26 | Discharge: 2022-08-26 | Disposition: A | Discharge status: Home / Self Care | Payer: 59 | Source: Ambulatory Visit | Attending: Obstetrics & Gynecology | Admitting: Obstetrics & Gynecology

## 2022-08-26 DIAGNOSIS — Z793 Long term (current) use of hormonal contraceptives: Secondary | ICD-10-CM

## 2022-11-03 ENCOUNTER — Ambulatory Visit (INDEPENDENT_AMBULATORY_CARE_PROVIDER_SITE_OTHER): Payer: 59 | Admitting: Family Medicine

## 2022-11-03 ENCOUNTER — Encounter: Payer: Self-pay | Admitting: Family Medicine

## 2022-11-03 VITALS — BP 111/74 | HR 74 | Temp 98.1°F | Resp 16 | Ht 68.0 in | Wt 296.0 lb

## 2022-11-03 DIAGNOSIS — M79672 Pain in left foot: Secondary | ICD-10-CM | POA: Diagnosis not present

## 2022-11-03 DIAGNOSIS — Z6841 Body Mass Index (BMI) 40.0 and over, adult: Secondary | ICD-10-CM

## 2022-11-03 DIAGNOSIS — Z7689 Persons encountering health services in other specified circumstances: Secondary | ICD-10-CM | POA: Diagnosis not present
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
## 2022-11-03 NOTE — Progress Notes (Unsigned)
Patient is here to established care with provider today. Patient has many health concern they would like to discuss with provider today  Care gaps discuss at appointment today  

## 2022-11-04 ENCOUNTER — Encounter: Payer: Self-pay | Admitting: Podiatry

## 2022-11-04 ENCOUNTER — Ambulatory Visit (INDEPENDENT_AMBULATORY_CARE_PROVIDER_SITE_OTHER): Payer: 59

## 2022-11-04 ENCOUNTER — Ambulatory Visit: Payer: 59 | Admitting: Podiatry

## 2022-11-04 ENCOUNTER — Encounter: Payer: Self-pay | Admitting: Family Medicine

## 2022-11-04 VITALS — BP 123/62 | HR 88

## 2022-11-04 DIAGNOSIS — M76822 Posterior tibial tendinitis, left leg: Secondary | ICD-10-CM

## 2022-11-04 DIAGNOSIS — M722 Plantar fascial fibromatosis: Secondary | ICD-10-CM

## 2022-11-04 DIAGNOSIS — M2141 Flat foot [pes planus] (acquired), right foot: Secondary | ICD-10-CM

## 2022-11-04 DIAGNOSIS — M2142 Flat foot [pes planus] (acquired), left foot: Secondary | ICD-10-CM

## 2022-11-04 MED ORDER — MELOXICAM 15 MG PO TABS: 15.0000 mg | ORAL_TABLET | Freq: Every day | ORAL | 3 refills | Status: DC

## 2022-11-04 MED ORDER — METHYLPREDNISOLONE 4 MG PO TBPK: ORAL_TABLET | ORAL | 0 refills | Status: DC

## 2022-11-04 NOTE — Patient Instructions (Signed)

## 2022-11-07 NOTE — Progress Notes (Signed)
  Subjective:  Patient ID: Anne Ray, female    DOB: 1991-01-22,  MRN: [id number]  Chief Complaint  Patient presents with   Foot Pain    Left foot pain /when walking or elevated    31 y.o. female presents with the above complaint. History confirmed with patient.  Is worse first in the morning she stepped down on the bottom of the heel and the inside of the ankle  Objective:  Physical Exam: warm, good capillary refill, no trophic changes or ulcerative lesions, normal DP and PT pulses, and normal sensory exam.  She has severe pes planus deformity Left Foot: point tenderness over the heel pad and tenderness along the PT tendon in the retromalleolar groove    Radiographs: Multiple views x-ray of the left foot: no fracture, dislocation, swelling or degenerative changes noted, plantar calcaneal spur, and pes planus Assessment:   1. Pes planus of both feet   2. Plantar fasciitis of left foot   3. Posterior tibial tendon dysfunction (PTTD) of left lower extremity      Plan:  Patient was evaluated and treated and all questions answered.  Discussed the etiology and treatment options for plantar fasciitis pes planus and PTTD including stretching, formal physical therapy, supportive shoegears such as a running shoe or sneaker, pre fabricated orthoses, injection therapy, and oral medications. We also discussed the role of surgical treatment of this for patients who do not improve after exhausting non-surgical treatment options.   -XR reviewed with patient -Educated patient on stretching and icing of the affected limb -Injection delivered to the plantar fascia of the left foot. -Rx for meloxicam. Educated on use, risks and benefits of the medication -Rx for medrol pack. Educated on use, risks, and benefits of the medication   Return in about 6 weeks (around 12/16/2022) for recheck plantar fasciitis.

## 2022-12-04 ENCOUNTER — Ambulatory Visit
Admission: EM | Admit: 2022-12-04 | Discharge: 2022-12-04 | Disposition: A | Discharge status: Home / Self Care | Payer: 59 | Attending: Nurse Practitioner | Admitting: Nurse Practitioner

## 2022-12-04 DIAGNOSIS — Z20822 Contact with and (suspected) exposure to covid-19: Secondary | ICD-10-CM | POA: Diagnosis present

## 2022-12-04 DIAGNOSIS — R6889 Other general symptoms and signs: Secondary | ICD-10-CM | POA: Insufficient documentation

## 2022-12-04 LAB — SARS CORONAVIRUS 2 (TAT 6-24 HRS): SARS Coronavirus 2: NEGATIVE

## 2022-12-04 MED ORDER — OSELTAMIVIR PHOSPHATE 75 MG PO CAPS: 75.0000 mg | ORAL_CAPSULE | Freq: Two times a day (BID) | ORAL | 0 refills | Status: DC

## 2022-12-04 MED ORDER — BENZONATATE 200 MG PO CAPS: 200.0000 mg | ORAL_CAPSULE | Freq: Three times a day (TID) | ORAL | 0 refills | Status: DC | PRN
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
## 2022-12-04 NOTE — ED Triage Notes (Signed)
Pt presents with ongoing sore throat and headache X 2 days; pt states he kids tested positive for covid 2 days ago but she tested negative.

## 2022-12-04 NOTE — Discharge Instructions (Signed)
Tamiflu sent to your pharmacy.  Please await results of COVID testing before starting this. Clinical contact you if your COVID test is positive Tessalon as needed for cough Rest and fluids Follow-up with your PCP 2 to 3 days for recheck Please go to the emergency room for any worsening symptoms

## 2022-12-16 ENCOUNTER — Ambulatory Visit: Payer: 59 | Admitting: Podiatry

## 2022-12-20 ENCOUNTER — Encounter (HOSPITAL_BASED_OUTPATIENT_CLINIC_OR_DEPARTMENT_OTHER): Payer: Self-pay

## 2022-12-20 ENCOUNTER — Other Ambulatory Visit: Payer: Self-pay

## 2022-12-20 DIAGNOSIS — K529 Noninfective gastroenteritis and colitis, unspecified: Secondary | ICD-10-CM | POA: Insufficient documentation

## 2022-12-20 DIAGNOSIS — R109 Unspecified abdominal pain: Secondary | ICD-10-CM | POA: Diagnosis present

## 2022-12-20 DIAGNOSIS — Z1152 Encounter for screening for COVID-19: Secondary | ICD-10-CM | POA: Insufficient documentation

## 2022-12-20 LAB — CBC WITH DIFFERENTIAL/PLATELET
Abs Immature Granulocytes: 0.02 10*3/uL (ref 0.00–0.07)
Basophils Absolute: 0 10*3/uL (ref 0.0–0.1)
Basophils Relative: 0 %
Eosinophils Absolute: 0.1 10*3/uL (ref 0.0–0.5)
Eosinophils Relative: 1 %
HCT: 44.8 % (ref 36.0–46.0)
Hemoglobin: 15.4 g/dL — ABNORMAL HIGH (ref 12.0–15.0)
Immature Granulocytes: 0 %
Lymphocytes Relative: 35 %
Lymphs Abs: 3 10*3/uL (ref 0.7–4.0)
MCH: 29.5 pg (ref 26.0–34.0)
MCHC: 34.4 g/dL (ref 30.0–36.0)
MCV: 85.8 fL (ref 80.0–100.0)
Monocytes Absolute: 0.8 10*3/uL (ref 0.1–1.0)
Monocytes Relative: 9 %
Neutro Abs: 4.7 10*3/uL (ref 1.7–7.7)
Neutrophils Relative %: 55 %
Platelets: 218 10*3/uL (ref 150–400)
RBC: 5.22 MIL/uL — ABNORMAL HIGH (ref 3.87–5.11)
RDW: 13.3 % (ref 11.5–15.5)
WBC: 8.6 10*3/uL (ref 4.0–10.5)
nRBC: 0 % (ref 0.0–0.2)

## 2022-12-20 LAB — URINALYSIS, ROUTINE W REFLEX MICROSCOPIC
Bilirubin Urine: NEGATIVE
Glucose, UA: NEGATIVE mg/dL
Hgb urine dipstick: NEGATIVE
Ketones, ur: NEGATIVE mg/dL
Leukocytes,Ua: NEGATIVE
Nitrite: NEGATIVE
Protein, ur: NEGATIVE mg/dL
Specific Gravity, Urine: 1.015 (ref 1.005–1.030)
pH: 5 (ref 5.0–8.0)

## 2022-12-20 LAB — COMPREHENSIVE METABOLIC PANEL
ALT: 18 U/L (ref 0–44)
AST: 20 U/L (ref 15–41)
Albumin: 4.7 g/dL (ref 3.5–5.0)
Alkaline Phosphatase: 38 U/L (ref 38–126)
Anion gap: 11 (ref 5–15)
BUN: 6 mg/dL (ref 6–20)
CO2: 18 mmol/L — ABNORMAL LOW (ref 22–32)
Calcium: 9.7 mg/dL (ref 8.9–10.3)
Chloride: 109 mmol/L (ref 98–111)
Creatinine, Ser: 0.76 mg/dL (ref 0.44–1.00)
GFR, Estimated: >60 mL/min (ref >60)
Glucose, Bld: 87 mg/dL (ref 70–99)
Potassium: 3.5 mmol/L (ref 3.5–5.1)
Sodium: 138 mmol/L (ref 135–145)
Total Bilirubin: 0.7 mg/dL (ref 0.3–1.2)
Total Protein: 8.3 g/dL — ABNORMAL HIGH (ref 6.5–8.1)

## 2022-12-20 LAB — RESP PANEL BY RT-PCR (RSV, FLU A&B, COVID)  RVPGX2
Influenza A by PCR: NEGATIVE
Influenza B by PCR: NEGATIVE
Resp Syncytial Virus by PCR: NEGATIVE
SARS Coronavirus 2 by RT PCR: NEGATIVE

## 2022-12-20 LAB — PREGNANCY, URINE: Preg Test, Ur: NEGATIVE

## 2022-12-20 NOTE — ED Triage Notes (Signed)
Abdominal pain and diarrhea since Friday +nausea no vomiting Able to keep fluids down

## 2022-12-21 ENCOUNTER — Emergency Department (HOSPITAL_BASED_OUTPATIENT_CLINIC_OR_DEPARTMENT_OTHER)
Admission: EM | Admit: 2022-12-21 | Discharge: 2022-12-21 | Disposition: A | Discharge status: Home / Self Care | Payer: 59 | Attending: Emergency Medicine | Admitting: Emergency Medicine

## 2022-12-21 DIAGNOSIS — K529 Noninfective gastroenteritis and colitis, unspecified: Secondary | ICD-10-CM

## 2022-12-21 MED ORDER — DICYCLOMINE HCL 20 MG PO TABS: 20.0000 mg | ORAL_TABLET | Freq: Two times a day (BID) | ORAL | 0 refills | Status: DC | PRN

## 2022-12-21 MED ORDER — ONDANSETRON 4 MG PO TBDP
4.0000 mg | ORAL_TABLET | Freq: Once | ORAL | Status: AC
  Administered 2022-12-21: 4 mg via ORAL
  Filled 2022-12-21: qty 1

## 2022-12-21 MED ORDER — LOPERAMIDE HCL 2 MG PO CAPS
4.0000 mg | ORAL_CAPSULE | Freq: Once | ORAL | Status: AC
  Administered 2022-12-21: 4 mg via ORAL
  Filled 2022-12-21: qty 2

## 2022-12-21 MED ORDER — LOPERAMIDE HCL 2 MG PO CAPS: 2.0000 mg | ORAL_CAPSULE | Freq: Four times a day (QID) | ORAL | 0 refills | Status: DC | PRN

## 2022-12-21 MED ORDER — ONDANSETRON 4 MG PO TBDP: 4.0000 mg | ORAL_TABLET | Freq: Three times a day (TID) | ORAL | 0 refills | Status: DC | PRN
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
## 2022-12-21 NOTE — Discharge Instructions (Signed)
You were seen today and likely have viral gastroenteritis.  You tested negative for COVID, influenza, RSV.  Take medications as prescribed.  Make sure that you are staying hydrated.

## 2023-01-29 ENCOUNTER — Emergency Department (HOSPITAL_BASED_OUTPATIENT_CLINIC_OR_DEPARTMENT_OTHER)
Admission: EM | Admit: 2023-01-29 | Discharge: 2023-01-29 | Disposition: A | Discharge status: Home / Self Care | Payer: 59 | Attending: Emergency Medicine | Admitting: Emergency Medicine

## 2023-01-29 ENCOUNTER — Encounter (HOSPITAL_BASED_OUTPATIENT_CLINIC_OR_DEPARTMENT_OTHER): Payer: Self-pay | Admitting: Emergency Medicine

## 2023-01-29 ENCOUNTER — Other Ambulatory Visit: Payer: Self-pay

## 2023-01-29 DIAGNOSIS — Y92410 Unspecified street and highway as the place of occurrence of the external cause: Secondary | ICD-10-CM | POA: Insufficient documentation

## 2023-01-29 DIAGNOSIS — M7918 Myalgia, other site: Secondary | ICD-10-CM

## 2023-01-29 DIAGNOSIS — M25512 Pain in left shoulder: Secondary | ICD-10-CM | POA: Insufficient documentation

## 2023-01-29 DIAGNOSIS — R0789 Other chest pain: Secondary | ICD-10-CM | POA: Insufficient documentation

## 2023-01-29 MED ORDER — CYCLOBENZAPRINE HCL 10 MG PO TABS: 10.0000 mg | ORAL_TABLET | Freq: Two times a day (BID) | ORAL | 0 refills | Status: DC | PRN

## 2023-01-29 MED ORDER — IBUPROFEN 600 MG PO TABS: 600.0000 mg | ORAL_TABLET | Freq: Four times a day (QID) | ORAL | 0 refills | Status: DC | PRN

## 2023-01-29 MED ORDER — KETOROLAC TROMETHAMINE 30 MG/ML IJ SOLN
30.0000 mg | Freq: Once | INTRAMUSCULAR | Status: AC
  Administered 2023-01-29: 30 mg via INTRAMUSCULAR
  Filled 2023-01-29: qty 1
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
## 2023-02-02 ENCOUNTER — Ambulatory Visit (INDEPENDENT_AMBULATORY_CARE_PROVIDER_SITE_OTHER): Payer: 59 | Admitting: Family Medicine

## 2023-02-02 ENCOUNTER — Encounter: Payer: Self-pay | Admitting: Family Medicine

## 2023-02-02 VITALS — BP 117/78 | HR 71 | Temp 98.1°F | Resp 16 | Ht 69.0 in | Wt 297.6 lb

## 2023-02-02 DIAGNOSIS — Z130 Encounter for screening for diseases of the blood and blood-forming organs and certain disorders involving the immune mechanism: Secondary | ICD-10-CM | POA: Diagnosis not present

## 2023-02-02 DIAGNOSIS — Z1329 Encounter for screening for other suspected endocrine disorder: Secondary | ICD-10-CM | POA: Diagnosis not present

## 2023-02-02 DIAGNOSIS — E6609 Other obesity due to excess calories: Secondary | ICD-10-CM

## 2023-02-02 DIAGNOSIS — Z6841 Body Mass Index (BMI) 40.0 and over, adult: Secondary | ICD-10-CM

## 2023-02-02 DIAGNOSIS — Z13228 Encounter for screening for other metabolic disorders: Secondary | ICD-10-CM | POA: Diagnosis not present

## 2023-02-02 DIAGNOSIS — Z13220 Encounter for screening for lipoid disorders: Secondary | ICD-10-CM

## 2023-02-02 DIAGNOSIS — Z0000 Encounter for general adult medical examination without abnormal findings: Secondary | ICD-10-CM | POA: Diagnosis not present

## 2023-02-02 DIAGNOSIS — R7989 Other specified abnormal findings of blood chemistry: Secondary | ICD-10-CM
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
## 2023-02-03 LAB — CMP14+EGFR
ALT: 12 IU/L (ref 0–32)
AST: 14 IU/L (ref 0–40)
Albumin/Globulin Ratio: 1.5 (ref 1.2–2.2)
Albumin: 4 g/dL (ref 3.9–4.9)
Alkaline Phosphatase: 43 IU/L — ABNORMAL LOW (ref 44–121)
BUN/Creatinine Ratio: 11 (ref 9–23)
BUN: 8 mg/dL (ref 6–20)
Bilirubin Total: 0.2 mg/dL (ref 0.0–1.2)
CO2: 16 mmol/L — ABNORMAL LOW (ref 20–29)
Calcium: 9.2 mg/dL (ref 8.7–10.2)
Chloride: 109 mmol/L — ABNORMAL HIGH (ref 96–106)
Creatinine, Ser: 0.74 mg/dL (ref 0.57–1.00)
Globulin, Total: 2.7 g/dL (ref 1.5–4.5)
Glucose: 96 mg/dL (ref 70–99)
Potassium: 4.3 mmol/L (ref 3.5–5.2)
Sodium: 143 mmol/L (ref 134–144)
Total Protein: 6.7 g/dL (ref 6.0–8.5)
eGFR: 111 mL/min/{1.73_m2} (ref >59)

## 2023-02-03 LAB — CBC WITH DIFFERENTIAL/PLATELET
Basophils Absolute: 0.1 10*3/uL (ref 0.0–0.2)
Basos: 1 %
EOS (ABSOLUTE): 0.1 10*3/uL (ref 0.0–0.4)
Eos: 2 %
Hematocrit: 42.1 % (ref 34.0–46.6)
Hemoglobin: 14 g/dL (ref 11.1–15.9)
Immature Grans (Abs): 0 10*3/uL (ref 0.0–0.1)
Immature Granulocytes: 0 %
Lymphocytes Absolute: 2.5 10*3/uL (ref 0.7–3.1)
Lymphs: 32 %
MCH: 29.2 pg (ref 26.6–33.0)
MCHC: 33.3 g/dL (ref 31.5–35.7)
MCV: 88 fL (ref 79–97)
Monocytes Absolute: 0.6 10*3/uL (ref 0.1–0.9)
Monocytes: 8 %
Neutrophils Absolute: 4.6 10*3/uL (ref 1.4–7.0)
Neutrophils: 57 %
Platelets: 202 10*3/uL (ref 150–450)
RBC: 4.79 x10E6/uL (ref 3.77–5.28)
RDW: 13.1 % (ref 11.7–15.4)
WBC: 8 10*3/uL (ref 3.4–10.8)

## 2023-02-03 LAB — TSH: TSH: 0.41 u[IU]/mL — ABNORMAL LOW (ref 0.450–4.500)

## 2023-02-03 LAB — LIPID PANEL
Chol/HDL Ratio: 4 ratio (ref 0.0–4.4)
Cholesterol, Total: 157 mg/dL (ref 100–199)
HDL: 39 mg/dL — ABNORMAL LOW (ref >39)
LDL Chol Calc (NIH): 93 mg/dL (ref 0–99)
Triglycerides: 141 mg/dL (ref 0–149)
VLDL Cholesterol Cal: 25 mg/dL (ref 5–40)

## 2023-03-15 ENCOUNTER — Telehealth: Payer: Self-pay | Admitting: *Deleted

## 2023-03-15 NOTE — Telephone Encounter (Signed)
I have attempted without success to contact this patient by phone to return their call and I left a message on answering machine.    Copied from CRM [id number]. Topic: General - Call Back - No Documentation >> Mar 15, 2023 11:41 AM Macon Large wrote: Reason for CRM: Pt reports that she received a call asking her to schedule an appt to recheck her labs but there is no lab order. Cb# [phone]

## 2023-03-21 ENCOUNTER — Other Ambulatory Visit: Payer: 59

## 2023-03-21 ENCOUNTER — Other Ambulatory Visit: Payer: Self-pay | Admitting: *Deleted

## 2023-03-21 DIAGNOSIS — R7989 Other specified abnormal findings of blood chemistry: Secondary | ICD-10-CM

## 2023-03-22 LAB — TSH+T4F+T3FREE
Free T4: 1.13 ng/dL (ref 0.82–1.77)
T3, Free: 3.2 pg/mL (ref 2.0–4.4)
TSH: 1.28 u[IU]/mL (ref 0.450–4.500)

## 2023-03-22 LAB — SPECIMEN STATUS REPORT

## 2023-04-12 ENCOUNTER — Ambulatory Visit (INDEPENDENT_AMBULATORY_CARE_PROVIDER_SITE_OTHER): Payer: 59 | Admitting: Podiatry

## 2023-04-12 DIAGNOSIS — M722 Plantar fascial fibromatosis: Secondary | ICD-10-CM

## 2023-04-12 DIAGNOSIS — M7672 Peroneal tendinitis, left leg: Secondary | ICD-10-CM | POA: Diagnosis not present

## 2023-04-12 DIAGNOSIS — M2142 Flat foot [pes planus] (acquired), left foot: Secondary | ICD-10-CM

## 2023-04-12 DIAGNOSIS — M2141 Flat foot [pes planus] (acquired), right foot: Secondary | ICD-10-CM

## 2023-04-12 DIAGNOSIS — M76822 Posterior tibial tendinitis, left leg: Secondary | ICD-10-CM

## 2023-04-12 MED ORDER — METHYLPREDNISOLONE 4 MG PO TBPK: ORAL_TABLET | ORAL | 0 refills | Status: AC

## 2023-04-12 MED ORDER — MELOXICAM 15 MG PO TABS: 15.0000 mg | ORAL_TABLET | Freq: Every day | ORAL | 2 refills | Status: DC

## 2023-04-12 NOTE — Patient Instructions (Signed)
Call to schedule physical therapy: [facility] at [place] [address]  [phone]  

## 2023-04-12 NOTE — Progress Notes (Signed)
  Subjective:  Patient ID: Anne Ray, female    DOB: [birth year],  MRN: [id number]  Chief Complaint  Patient presents with   Flat Foot    follow up on left foot pain - still very painful, hurts all the time    32 y.o. female presents with the above complaint. History confirmed with patient.  Still very painful first in the morning it is worse after the last injection.  Has not helped at all.  Objective:  Physical Exam: warm, good capillary refill, no trophic changes or ulcerative lesions, normal DP and PT pulses, and normal sensory exam.  She has severe pes planus deformity  Left Foot: point tenderness over the heel pad and tenderness along the PT tendon in the retromalleolar groove    Radiographs: Multiple views x-ray of the left foot: no fracture, dislocation, swelling or degenerative changes noted, plantar calcaneal spur, and pes planus Assessment:   1. Pes planus of both feet   2. Plantar fasciitis of left foot   3. Posterior tibial tendon dysfunction (PTTD) of left lower extremity   4. Peroneal tendinitis, left      Plan:  Patient was evaluated and treated and all questions answered.  Still having severe PTTD and plantar fasciitis type symptoms as well as now peroneal tendinitis radiating up into the ankle.  Has not had any improvement after the last injection.  Discussed further treatment with her I recommended immobilization and support to allow the soft tissues to heal and be offloaded in a cam walker boot.  This was dispensed.  I also recommended formal physical therapy.  Referral sent to [facility].  Mobic and methylprednisolone taper sent to pharmacy to repeat this course.  If no improvement in 6 weeks I would recommend an MRI.  She will follow-up with me in 12 weeks for further treatment.   Return in about 6 weeks (around 05/24/2023) for f/u on PT tendon, peroneal tendon and plantar fasciitis.

## 2023-05-10 ENCOUNTER — Ambulatory Visit: Payer: 59 | Admitting: Physical Therapy

## 2023-05-18 ENCOUNTER — Ambulatory Visit: Payer: 59 | Attending: Podiatry | Admitting: Physical Therapy
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
## 2023-05-25 ENCOUNTER — Ambulatory Visit: Payer: 59 | Admitting: Podiatry

## 2023-06-01 ENCOUNTER — Encounter: Payer: Self-pay | Admitting: Physical Therapy

## 2023-06-01 ENCOUNTER — Ambulatory Visit: Payer: 59 | Attending: Podiatry | Admitting: Physical Therapy

## 2023-06-01 DIAGNOSIS — M7672 Peroneal tendinitis, left leg: Secondary | ICD-10-CM | POA: Insufficient documentation

## 2023-06-01 DIAGNOSIS — R2689 Other abnormalities of gait and mobility: Secondary | ICD-10-CM | POA: Insufficient documentation

## 2023-06-01 DIAGNOSIS — M2141 Flat foot [pes planus] (acquired), right foot: Secondary | ICD-10-CM | POA: Insufficient documentation

## 2023-06-01 DIAGNOSIS — M6281 Muscle weakness (generalized): Secondary | ICD-10-CM | POA: Insufficient documentation

## 2023-06-01 DIAGNOSIS — M25572 Pain in left ankle and joints of left foot: Secondary | ICD-10-CM | POA: Insufficient documentation

## 2023-06-01 DIAGNOSIS — M722 Plantar fascial fibromatosis: Secondary | ICD-10-CM | POA: Diagnosis not present

## 2023-06-01 DIAGNOSIS — M2142 Flat foot [pes planus] (acquired), left foot: Secondary | ICD-10-CM | POA: Diagnosis not present

## 2023-06-01 DIAGNOSIS — M76822 Posterior tibial tendinitis, left leg: Secondary | ICD-10-CM | POA: Insufficient documentation

## 2023-06-01 DIAGNOSIS — M25672 Stiffness of left ankle, not elsewhere classified: Secondary | ICD-10-CM | POA: Diagnosis present
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
## 2023-06-16 ENCOUNTER — Ambulatory Visit: Payer: 59

## 2023-06-23 ENCOUNTER — Ambulatory Visit: Payer: 59

## 2023-06-23 ENCOUNTER — Telehealth: Payer: Self-pay

## 2023-06-23 NOTE — Telephone Encounter (Signed)
Patient Name: Anne Ray MRN: [id number] DOB:07-Mar-1991, 32 y.o., female Today's Date: 06/23/2023  Pt missed PT appt at 1:15pm today. Patient was called and she reported that she forgot about the PT appt today. Pt was reminded of her next appt on Monday 06/27/23 at 12:30pm.    Ileana Ladd, PT 06/23/2023, 1:31 PM

## 2023-06-27 ENCOUNTER — Ambulatory Visit: Payer: 59

## 2023-07-04 ENCOUNTER — Ambulatory Visit: Payer: 59 | Attending: Podiatry | Admitting: Physical Therapy

## 2023-07-05 ENCOUNTER — Ambulatory Visit: Payer: 59 | Admitting: Podiatry

## 2023-08-07 ENCOUNTER — Emergency Department (HOSPITAL_COMMUNITY)
Admission: EM | Admit: 2023-08-07 | Discharge: 2023-08-07 | Disposition: A | Discharge status: Home / Self Care | Payer: 59 | Attending: Emergency Medicine | Admitting: Emergency Medicine

## 2023-08-07 ENCOUNTER — Other Ambulatory Visit: Payer: Self-pay

## 2023-08-07 ENCOUNTER — Encounter (HOSPITAL_COMMUNITY): Payer: Self-pay

## 2023-08-07 DIAGNOSIS — L03213 Periorbital cellulitis: Secondary | ICD-10-CM | POA: Diagnosis not present

## 2023-08-07 DIAGNOSIS — H5712 Ocular pain, left eye: Secondary | ICD-10-CM | POA: Diagnosis present

## 2023-08-07 MED ORDER — AMOXICILLIN-POT CLAVULANATE 875-125 MG PO TABS
1.0000 | ORAL_TABLET | Freq: Once | ORAL | Status: AC
  Administered 2023-08-07: 1 via ORAL
  Filled 2023-08-07: qty 1

## 2023-08-07 MED ORDER — AMOXICILLIN-POT CLAVULANATE 875-125 MG PO TABS: 1.0000 | ORAL_TABLET | Freq: Two times a day (BID) | ORAL | 0 refills | Status: DC

## 2023-08-07 MED ORDER — SULFAMETHOXAZOLE-TRIMETHOPRIM 800-160 MG PO TABS: 1.0000 | ORAL_TABLET | Freq: Two times a day (BID) | ORAL | 0 refills | Status: AC

## 2023-08-07 MED ORDER — ERYTHROMYCIN 5 MG/GM OP OINT: TOPICAL_OINTMENT | OPHTHALMIC | 0 refills | Status: DC

## 2023-08-07 MED ORDER — SULFAMETHOXAZOLE-TRIMETHOPRIM 800-160 MG PO TABS
1.0000 | ORAL_TABLET | Freq: Once | ORAL | Status: AC
  Administered 2023-08-07: 1 via ORAL
  Filled 2023-08-07 (×2): qty 1

## 2023-08-07 MED ORDER — ERYTHROMYCIN 5 MG/GM OP OINT
TOPICAL_OINTMENT | Freq: Once | OPHTHALMIC | Status: AC
  Filled 2023-08-07: qty 3.5

## 2023-08-07 MED ORDER — TETRACAINE HCL 0.5 % OP SOLN
2.0000 [drp] | Freq: Once | OPHTHALMIC | Status: AC
  Administered 2023-08-07: 2 [drp] via OPHTHALMIC
  Filled 2023-08-07: qty 4

## 2023-08-07 NOTE — ED Triage Notes (Signed)
Pt arrived POV with left eye pain x3 days. Atraumatic. Reports drainage and itching.

## 2023-08-07 NOTE — ED Provider Notes (Signed)
[place] EMERGENCY DEPARTMENT AT [facility] Provider Note   CSN: [id number] Arrival date & time: 08/07/23  0756     History  Chief Complaint  Patient presents with   Eye Problem    Anne Ray is a 32 y.o. female with a past medical history significant for history of migraines and sickle cell trait who presents to the ED due to left eye edema x 3 days.  Admits to purulent drainage from left eye.  Admits to some blurry vision from drainage.  No pain with EOMs.  No sick contacts.  Admits to foreign body sensation in left eye.  No injury to eye.  No fever or chills. Does no wear glasses or contacts.   History obtained from patient and past medical records. No interpreter used during encounter.       Home Medications Prior to Admission medications   Medication Sig Start Date End Date Taking? Authorizing Provider  amoxicillin-clavulanate (AUGMENTIN) 875-125 MG tablet Take 1 tablet by mouth every 12 (twelve) hours. 08/07/23  Yes Ociel Retherford, Merla Riches, PA-C  erythromycin ophthalmic ointment Place a 1/2 inch ribbon of ointment into the lower eyelid. 08/07/23  Yes Stuart Mirabile C, PA-C  sulfamethoxazole-trimethoprim (BACTRIM DS) 800-160 MG tablet Take 1 tablet by mouth 2 (two) times daily for 7 days. 08/07/23 08/14/23 Yes Montia Haslip, Merla Riches, PA-C  cyclobenzaprine (FLEXERIL) 10 MG tablet Take 1 tablet (10 mg total) by mouth 2 (two) times daily as needed for muscle spasms. 01/29/23   Horton, Mayer Masker, MD  ibuprofen (ADVIL) 600 MG tablet Take 1 tablet (600 mg total) by mouth every 6 (six) hours as needed. 01/29/23   Horton, Mayer Masker, MD  meloxicam (MOBIC) 15 MG tablet Take 1 tablet (15 mg total) by mouth daily. 04/18/23   McDonald, Rachelle Hora, DPM  methylPREDNISolone (MEDROL DOSEPAK) 4 MG TBPK tablet 6 day dose pack - take as directed 04/12/23   Edwin Cap, DPM      Allergies    Food and Tramadol    Review of Systems   Review of Systems  Constitutional:  Negative for  fever.  HENT:  Positive for ear discharge, ear pain and facial swelling.     Physical Exam Updated Vital Signs BP (!) 166/97 (BP Location: Right Arm)   Pulse 69   Temp 98.2 F (36.8 C) (Oral)   Resp 18   Ht 5\' 9"  (1.753 m)   Wt 135 kg   SpO2 98%   BMI 43.95 kg/m  Physical Exam Vitals and nursing note reviewed.  Constitutional:      General: She is not in acute distress.    Appearance: She is not ill-appearing.  HENT:     Head: Normocephalic.  Eyes:     Pupils: Pupils are equal, round, and reactive to light.     Comments: EOMS intact. Periorbital edema to left eye. Slight fluorescein uptake. IOP left 17, right 18.   Cardiovascular:     Rate and Rhythm: Normal rate and regular rhythm.     Pulses: Normal pulses.     Heart sounds: Normal heart sounds. No murmur heard.    No friction rub. No gallop.  Pulmonary:     Effort: Pulmonary effort is normal.     Breath sounds: Normal breath sounds.  Abdominal:     General: Abdomen is flat. There is no distension.     Palpations: Abdomen is soft.     Tenderness: There is no abdominal tenderness. There is no  guarding or rebound.  Musculoskeletal:        General: Normal range of motion.     Cervical back: Neck supple.  Skin:    General: Skin is warm and dry.  Neurological:     General: No focal deficit present.     Mental Status: She is alert.  Psychiatric:        Mood and Affect: Mood normal.        Behavior: Behavior normal.     ED Results / Procedures / Treatments   Labs (all labs ordered are listed, but only abnormal results are displayed) Labs Reviewed - No data to display  EKG None  Radiology No results found.  Procedures Procedures    Medications Ordered in ED Medications  sulfamethoxazole-trimethoprim (BACTRIM DS) 800-160 MG per tablet 1 tablet (has no administration in time range)  amoxicillin-clavulanate (AUGMENTIN) 875-125 MG per tablet 1 tablet (has no administration in time range)  erythromycin  ophthalmic ointment (has no administration in time range)  tetracaine (PONTOCAINE) 0.5 % ophthalmic solution 2 drop (2 drops Both Eyes Given 08/07/23 0840)    ED Course/ Medical Decision Making/ A&P                                 Medical Decision Making Risk Prescription drug management.   32 year old female presents to the ED due to left eye edema x 3 days associated with purulent drainage.  No fever or chills.  Admits to blurry vision secondary to drainage.  Admits to foreign body sensation.  No trauma to the left eye. Does not wear glasses or contacts. Upon arrival, stable vitals.  Patient in no acute distress.  Periorbital edema to left eye, most significantly below left eye.  EOMs intact.  Slight fluorescein uptake in left eye. No proptosis or pain with EOMs. Low suspicion for orbital cellulitis, so will hold off on CT at this time. Normal IOP bilaterally. Low suspicion for glaucoma. Will treat for corneal abrasion and possible periorbital cellulitis. Patient given first dose of medications here in the ED. Ophthalmology number given at discharge and advised to call if symptoms do not improve over the next 2 to 3 days or return for worsening symptoms. Patient stable for discharge. Strict ED precautions discussed with patient. Patient states understanding and agrees to plan. Patient discharged home in no acute distress and stable vitals  Lives at home Hx of migraines Has PCP       Final Clinical Impression(s) / ED Diagnoses Final diagnoses:  Periorbital cellulitis of left eye    Rx / DC Orders ED Discharge Orders          Ordered    sulfamethoxazole-trimethoprim (BACTRIM DS) 800-160 MG tablet  2 times daily        08/07/23 0848    amoxicillin-clavulanate (AUGMENTIN) 875-125 MG tablet  Every 12 hours        08/07/23 0848    erythromycin ophthalmic ointment        08/07/23 0848              Mannie Stabile, PA-C 08/07/23 0857    Wynetta Fines, MD 08/07/23  1513

## 2023-08-07 NOTE — Discharge Instructions (Addendum)
It was a pleasure taking care of you today.  As discussed, I am sending you home with antibiotics.  Take as prescribed and finish all antibiotics.  I have included the number of the eye doctor.  Please call to schedule an appointment tomorrow for further evaluation.  Please follow-up with PCP in 2 to 3 days.  Return to the ER for any worsening symptoms.

## 2023-08-11 ENCOUNTER — Inpatient Hospital Stay: Payer: 59 | Admitting: Family Medicine

## 2024-02-02 ENCOUNTER — Ambulatory Visit: Payer: Self-pay | Admitting: Family Medicine

## 2024-02-02 ENCOUNTER — Ambulatory Visit: Admitting: Family Medicine

## 2024-02-02 NOTE — Telephone Encounter (Signed)
 Patient call with c/o sore throat. Patient scheduled with PCP for 3:40 today.

## 2024-02-02 NOTE — Telephone Encounter (Signed)
  Chief Complaint: sore throat Symptoms: sore throat, cough, headache Frequency: Began at least one month ago, worsening over the last 1-2 weeks Pertinent Negatives: Patient denies fever, SOB, difficulty swallowing Disposition: [] ED /[] Urgent Care (no appt availability in office) / [x] Appointment(In office/virtual)/ []  [facility]/ [] Home Care/ [x] Refused Recommended Disposition /[] [facility]/ []  Follow-up with PCP Additional Notes: Patient calls reporting sore throat x1 month or longer and cough worsening over the last two weeks. Patient states that she feels she may have tonsil stones. Per protocol, patient to be evaluated within 3 days. First available appointment with PCP or any provider in clinic outside of guideline. Offered patient appt at alternate clinic nearby, patient declined stating she will go to urgent care after work. Care advice reviewed, patient verbalized understanding and denies further questions at this time. Alerting PCP for review.    Copied from CRM [id number]. Topic: Clinical - Red Word Triage >> Feb 02, 2024  9:25 AM Shon Hale wrote: Red Word that prompted transfer to Nurse Triage: Having a lot of throat pain. Its been about a month, patient also has had a lot of tonsil stones. Rates pain at 7, hurts a lot when throat is dry. Reason for Disposition  [1] Sore throat is the only symptom AND [2] present > 48 hours  Answer Assessment - Initial Assessment Questions 1. ONSET: "When did the throat start hurting?" (Hours or days ago)      One month or longer, worsening over the last 1-2 weeks 2. SEVERITY: "How bad is the sore throat?" (Scale 1-10; mild, moderate or severe)   - MILD (1-3):  Doesn't interfere with eating or normal activities.   - MODERATE (4-7): Interferes with eating some solids and normal activities.   - SEVERE (8-10):  Excruciating pain, interferes with most normal activities.   - SEVERE WITH DYSPHAGIA (10): Can't swallow liquids,  drooling.     Slight, 6/10 3. STREP EXPOSURE: "Has there been any exposure to strep within the past week?" If Yes, ask: "What type of contact occurred?"      Denies 4.  VIRAL SYMPTOMS: "Are there any symptoms of a cold, such as a runny nose, cough, hoarse voice or red eyes?"      Cough, hoarse voice 5. FEVER: "Do you have a fever?" If Yes, ask: "What is your temperature, how was it measured, and when did it start?"     Denies 6. PUS ON THE TONSILS: "Is there pus on the tonsils in the back of your throat?"     States she thinks she has tonsil stones 7. OTHER SYMPTOMS: "Do you have any other symptoms?" (e.g., difficulty breathing, headache, rash)     Headache 8. PREGNANCY: "Is there any chance you are pregnant?" "When was your last menstrual period?"     Unsure, LMP: February, unsure of date, irregular periods  Protocols used: Sore Throat-A-AH

## 2024-03-29 ENCOUNTER — Emergency Department (HOSPITAL_COMMUNITY)
Admission: EM | Admit: 2024-03-29 | Discharge: 2024-03-29 | Disposition: A | Discharge status: Home / Self Care | Source: Home / Self Care | Attending: Emergency Medicine | Admitting: Emergency Medicine

## 2024-03-29 ENCOUNTER — Emergency Department (HOSPITAL_COMMUNITY)
Admission: EM | Admit: 2024-03-29 | Discharge: 2024-03-29 | Disposition: A | Discharge status: Home / Self Care | Attending: Emergency Medicine | Admitting: Emergency Medicine

## 2024-03-29 ENCOUNTER — Other Ambulatory Visit: Payer: Self-pay

## 2024-03-29 DIAGNOSIS — L03213 Periorbital cellulitis: Secondary | ICD-10-CM | POA: Diagnosis not present

## 2024-03-29 DIAGNOSIS — Z202 Contact with and (suspected) exposure to infections with a predominantly sexual mode of transmission: Secondary | ICD-10-CM | POA: Insufficient documentation

## 2024-03-29 DIAGNOSIS — R220 Localized swelling, mass and lump, head: Secondary | ICD-10-CM | POA: Insufficient documentation

## 2024-03-29 DIAGNOSIS — H5789 Other specified disorders of eye and adnexa: Secondary | ICD-10-CM | POA: Diagnosis present

## 2024-03-29 MED ORDER — SULFAMETHOXAZOLE-TRIMETHOPRIM 800-160 MG PO TABS: 1.0000 | ORAL_TABLET | Freq: Two times a day (BID) | ORAL | 0 refills | Status: AC

## 2024-03-29 MED ORDER — ERYTHROMYCIN 5 MG/GM OP OINT: TOPICAL_OINTMENT | OPHTHALMIC | 0 refills | Status: DC

## 2024-03-29 MED ORDER — FLUCONAZOLE 150 MG PO TABS: 150.0000 mg | ORAL_TABLET | Freq: Every day | ORAL | 0 refills | Status: DC

## 2024-03-29 MED ORDER — CEFTRIAXONE SODIUM 500 MG IJ SOLR
500.0000 mg | Freq: Once | INTRAMUSCULAR | Status: AC
  Administered 2024-03-29: 500 mg via INTRAMUSCULAR
  Filled 2024-03-29: qty 500

## 2024-03-29 MED ORDER — AMOXICILLIN-POT CLAVULANATE 875-125 MG PO TABS: 1.0000 | ORAL_TABLET | Freq: Two times a day (BID) | ORAL | 0 refills | Status: DC

## 2024-03-29 MED ORDER — DOXYCYCLINE HYCLATE 100 MG PO CAPS
100.0000 mg | ORAL_CAPSULE | Freq: Two times a day (BID) | ORAL | 0 refills | Status: AC
Start: 2024-03-29 — End: 2024-04-05

## 2024-03-29 MED ORDER — ERYTHROMYCIN 5 MG/GM OP OINT
TOPICAL_OINTMENT | Freq: Once | OPHTHALMIC | Status: AC
  Filled 2024-03-29: qty 3.5

## 2024-03-29 MED ORDER — LIDOCAINE HCL (PF) 1 % IJ SOLN
2.0000 mL | Freq: Once | INTRAMUSCULAR | Status: AC
  Administered 2024-03-29: 1 mL
  Filled 2024-03-29: qty 5

## 2024-03-29 MED ORDER — HYDROCODONE-ACETAMINOPHEN 5-325 MG PO TABS
1.0000 | ORAL_TABLET | Freq: Once | ORAL | Status: AC
  Administered 2024-03-29: 1 via ORAL
  Filled 2024-03-29: qty 1

## 2024-03-29 MED ORDER — TETRACAINE HCL 0.5 % OP SOLN
2.0000 [drp] | Freq: Once | OPHTHALMIC | Status: AC
  Administered 2024-03-29: 2 [drp] via OPHTHALMIC
  Filled 2024-03-29: qty 4

## 2024-03-29 MED ORDER — AMOXICILLIN-POT CLAVULANATE 875-125 MG PO TABS
1.0000 | ORAL_TABLET | Freq: Once | ORAL | Status: AC
  Administered 2024-03-29: 1 via ORAL
  Filled 2024-03-29: qty 1

## 2024-03-29 MED ORDER — SULFAMETHOXAZOLE-TRIMETHOPRIM 800-160 MG PO TABS
1.0000 | ORAL_TABLET | Freq: Once | ORAL | Status: AC
  Administered 2024-03-29: 1 via ORAL
  Filled 2024-03-29: qty 1

## 2024-03-29 MED ORDER — FLUORESCEIN SODIUM 1 MG OP STRP
1.0000 | ORAL_STRIP | Freq: Once | OPHTHALMIC | Status: AC
  Administered 2024-03-29: 1 via OPHTHALMIC
  Filled 2024-03-29: qty 1

## 2024-03-29 NOTE — ED Provider Notes (Signed)
 [place] EMERGENCY DEPARTMENT AT [facility] Provider Note   CSN: [id number] Arrival date & time: 03/29/24  0259     History  Chief Complaint  Patient presents with   Facial Swelling    L Lower    Anne Ray is a 33 y.o. female.  Patient with history of migraines, sickle cell trait presents today with complaints of left eye pain and swelling.  She states that same began 1 day ago and has been persistent since then.  She woke up with it.  Denies any eye trauma.  Denies any vision changes.  No headaches or painful eye movements.  No fevers or chills.  Notes a trace amount of drainage from her eye.  She does not wear contact lenses.  Does note that she had a similar episode of this back in September 2024 and was seen here and evaluated and given antibiotics. States that her symptoms resolved soon after with these interventions.  She thinks she followed up with ophthalmology but cannot remember what they did or told her.  This feels similar in nature to that episode.   The history is provided by the patient. No language interpreter was used.       Home Medications Prior to Admission medications   Medication Sig Start Date End Date Taking? Authorizing Provider  amoxicillin -clavulanate (AUGMENTIN ) 875-125 MG tablet Take 1 tablet by mouth every 12 (twelve) hours. 08/07/23   Aberman, Caroline C, PA-C  cyclobenzaprine  (FLEXERIL ) 10 MG tablet Take 1 tablet (10 mg total) by mouth 2 (two) times daily as needed for muscle spasms. 01/29/23   Horton, Vonzella Guernsey, MD  erythromycin  ophthalmic ointment Place a 1/2 inch ribbon of ointment into the lower eyelid. 08/07/23   Aberman, Caroline C, PA-C  ibuprofen  (ADVIL ) 600 MG tablet Take 1 tablet (600 mg total) by mouth every 6 (six) hours as needed. 01/29/23   Horton, Vonzella Guernsey, MD  meloxicam  (MOBIC ) 15 MG tablet Take 1 tablet (15 mg total) by mouth daily. 04/18/23   McDonald, Olive Better, DPM  methylPREDNISolone  (MEDROL  DOSEPAK) 4 MG TBPK tablet  6 day dose pack - take as directed 04/12/23   Floyce Hutching, DPM      Allergies    Food and Tramadol    Review of Systems   Review of Systems  Eyes:  Positive for pain.  All other systems reviewed and are negative.   Physical Exam Updated Vital Signs BP (!) 148/105   Pulse (!) 58   Temp 98.2 F (36.8 C) (Oral)   Resp 18   SpO2 100%  Physical Exam Vitals and nursing note reviewed.  Constitutional:      General: She is not in acute distress.    Appearance: Normal appearance. She is normal weight. She is not ill-appearing, toxic-appearing or diaphoretic.  HENT:     Head: Normocephalic and atraumatic.  Eyes:     General: Lids are normal. Lids are everted, no foreign bodies appreciated. Vision grossly intact. No allergic shiner, visual field deficit or scleral icterus.       Left eye: No foreign body, discharge or hordeolum.     Extraocular Movements: Extraocular movements intact.     Left eye: Normal extraocular motion and no nystagmus.     Conjunctiva/sclera:     Left eye: Left conjunctiva is not injected. No chemosis, exudate or hemorrhage.    Pupils: Pupils are equal, round, and reactive to light.     Comments: Left lower eyelid with mild  amount of swelling.  No visualized hordeolum or chalazion.  No visualized purulent drainage.  No conjunctival changes.  Vision grossly intact.  Fluorescein  without uptake.  Automated IOP via Tono-Pen 19.  No proptosis or chemosis.  EOMs intact without pain.  Cardiovascular:     Rate and Rhythm: Normal rate.  Pulmonary:     Effort: Pulmonary effort is normal. No respiratory distress.  Musculoskeletal:        General: Normal range of motion.     Cervical back: Normal range of motion.  Skin:    General: Skin is warm and dry.  Neurological:     General: No focal deficit present.     Mental Status: She is alert.  Psychiatric:        Mood and Affect: Mood normal.        Behavior: Behavior normal.     ED Results / Procedures /  Treatments   Labs (all labs ordered are listed, but only abnormal results are displayed) Labs Reviewed - No data to display  EKG None  Radiology No results found.  Procedures Procedures    Medications Ordered in ED Medications  fluorescein  ophthalmic strip 1 strip (has no administration in time range)  tetracaine  (PONTOCAINE) 0.5 % ophthalmic solution 2 drop (has no administration in time range)  HYDROcodone -acetaminophen  (NORCO/VICODIN) 5-325 MG per tablet 1 tablet (has no administration in time range)  erythromycin  ophthalmic ointment (has no administration in time range)  sulfamethoxazole -trimethoprim  (BACTRIM  DS) 800-160 MG per tablet 1 tablet (has no administration in time range)  amoxicillin -clavulanate (AUGMENTIN ) 875-125 MG per tablet 1 tablet (has no administration in time range)    ED Course/ Medical Decision Making/ A&P                                 Medical Decision Making Risk Prescription drug management.   Patient presents today with complaints of left lower eyelid swelling and pain x 1 day.  She is afebrile, nontoxic-appearing, in no acute distress reassuring vital signs.  Physical exam reveals  Left lower eyelid with mild amount of swelling.  No visualized hordeolum or chalazion.  No visualized purulent drainage.  No conjunctival changes.  Vision grossly intact.  Fluorescein  without uptake.  Automated IOP via Tono-Pen 19.  No proptosis or chemosis.  EOMs intact without pain.  Chart reviewed, patient was seen for the symptoms back in September 2024, there was imaging available in her chart from this visit and upon review of these images, presentation today is almost identical to that visit.  Looks like she received erythromycin  ointment, Augmentin , and Bactrim  for symptoms at that visit.  She does note that she had improvement with these interventions.  Therefore it is reasonable to repeat treatment with those medicines.  Low suspicion for orbital cellulitis,  so will hold off on CT at this time. Low suspicion for glaucoma, no vision changes and normal IOP. Likely recurrence of periorbital cellulitis. Patient given first dose of medications here in the ED. Ophthalmology number given at discharge, emphasized importance of close follow-up given this is the second time she has had this happen there.  Evaluation and diagnostic testing in the emergency department does not suggest an emergent condition requiring admission or immediate intervention beyond what has been performed at this time.  Plan for discharge with close PCP follow-up.  Patient is understanding and amenable with plan, educated on red flag symptoms that would prompt immediate return.  Patient discharged in stable condition.  Final Clinical Impression(s) / ED Diagnoses Final diagnoses:  Periorbital cellulitis of left eye    Rx / DC Orders ED Discharge Orders          Ordered    amoxicillin -clavulanate (AUGMENTIN ) 875-125 MG tablet  Every 12 hours        03/29/24 0618    sulfamethoxazole -trimethoprim  (BACTRIM  DS) 800-160 MG tablet  2 times daily        03/29/24 0618          An After Visit Summary was printed and given to the patient.     Fredna Jasper 03/29/24 1610    Rory Collard, MD 03/31/24 [phone]

## 2024-03-29 NOTE — Discharge Instructions (Signed)
 As we discussed, your workup in the ER today was reassuring for acute findings.  I suspect that you are having a recurrence of the periorbital cellulitis that you were diagnosed with previously back in September.  We have treated this similarly given that it helped last time.  I have given you 2 antibiotics that you need to fill and take as prescribed in their entirety for management of your symptoms.  Also, you need to get into see ophthalmology who can further evaluate why this is recurring for you.  Please call them to schedule an appointment.  I have given you a referral with a number to call.  Return if development of any new or worsening symptoms.

## 2024-03-29 NOTE — ED Provider Notes (Signed)
 [place] EMERGENCY DEPARTMENT AT [facility] Provider Note   CSN: [id number] Arrival date & time: 03/29/24  1341     History  Chief Complaint  Patient presents with   Exposure to STD   Facial Swelling    Anne Ray is a 33 y.o. female.  33 year old female presents with concern for exposure to gonorrhea.  Patient states that she was contacted today by her significant other and told that his throat culture was positive for gonorrhea. She denies any symptoms. Seen here earlier for left eye stye, followed up with ophthalmology who has referred her to another ophthalmologist.  She denies any discharge from her eye.  She has not started the Bactrim  and Augmentin  prescribed earlier today for her periorbital cellulitis.       Home Medications Prior to Admission medications   Medication Sig Start Date End Date Taking? Authorizing Provider  doxycycline  (VIBRAMYCIN ) 100 MG capsule Take 1 capsule (100 mg total) by mouth 2 (two) times daily for 7 days. 03/29/24 04/05/24 Yes Darlis Eisenmenger, PA-C  erythromycin  ophthalmic ointment Place a 1/2 inch ribbon of ointment into the lower eyelid. 03/29/24  Yes Darlis Eisenmenger, PA-C  fluconazole  (DIFLUCAN ) 150 MG tablet Take 1 tablet (150 mg total) by mouth daily. 03/29/24  Yes Darlis Eisenmenger, PA-C  amoxicillin -clavulanate (AUGMENTIN ) 875-125 MG tablet Take 1 tablet by mouth every 12 (twelve) hours. 03/29/24   Smoot, Genevive Ket, PA-C  cyclobenzaprine  (FLEXERIL ) 10 MG tablet Take 1 tablet (10 mg total) by mouth 2 (two) times daily as needed for muscle spasms. 01/29/23   Horton, Vonzella Guernsey, MD  ibuprofen  (ADVIL ) 600 MG tablet Take 1 tablet (600 mg total) by mouth every 6 (six) hours as needed. 01/29/23   Horton, Vonzella Guernsey, MD  meloxicam  (MOBIC ) 15 MG tablet Take 1 tablet (15 mg total) by mouth daily. 04/18/23   McDonald, Olive Better, DPM  methylPREDNISolone  (MEDROL  DOSEPAK) 4 MG TBPK tablet 6 day dose pack - take as directed 04/12/23   McDonald, Adam R, DPM   sulfamethoxazole -trimethoprim  (BACTRIM  DS) 800-160 MG tablet Take 1 tablet by mouth 2 (two) times daily for 7 days. 03/29/24 04/05/24  Smoot, Genevive Ket, PA-C      Allergies    Food and Tramadol    Review of Systems   Review of Systems Negative except as per HPI Physical Exam Updated Vital Signs BP 138/84 (BP Location: Right Arm)   Pulse (!) 104   Temp 98.8 F (37.1 C) (Oral)   Resp 18   SpO2 100%  Physical Exam Vitals and nursing note reviewed.  Constitutional:      General: She is not in acute distress.    Appearance: She is well-developed. She is not diaphoretic.  HENT:     Head: Normocephalic and atraumatic.     Comments: Left lower eye stye with swelling to lower eyelid.  Extraocular moods intact, no except illness.  No discharge from the eye. Pulmonary:     Effort: Pulmonary effort is normal.  Skin:    General: Skin is warm and dry.  Neurological:     Mental Status: She is alert and oriented to person, place, and time.  Psychiatric:        Behavior: Behavior normal.     ED Results / Procedures / Treatments   Labs (all labs ordered are listed, but only abnormal results are displayed) Labs Reviewed - No data to display  EKG None  Radiology No results found.  Procedures Procedures  Medications Ordered in ED Medications  cefTRIAXone  (ROCEPHIN ) injection 500 mg (500 mg Intramuscular Given 03/29/24 1413)  lidocaine  (PF) (XYLOCAINE ) 1 % injection 2 mL (1 mL Other Given 03/29/24 1414)    ED Course/ Medical Decision Making/ A&P                                 Medical Decision Making Risk Prescription drug management.   33 year old female returns to the emergency room with concern for exposure to gonorrhea.  This will be treated with doxycycline  and IM Rocephin  in the ER.  She does not have any eye discharge to swab for gonorrhea however consider this, discussed with patient, provided with erythromycin  eye ointment should she develop discharge from her eye or if  eye is not improving.  Is advised for close follow-up with her ophthalmologist.  Is provided with prescription for Diflucan  should she develop a yeast infection.        Final Clinical Impression(s) / ED Diagnoses Final diagnoses:  Exposure to STD    Rx / DC Orders ED Discharge Orders          Ordered    fluconazole  (DIFLUCAN ) 150 MG tablet  Daily        03/29/24 1419    erythromycin  ophthalmic ointment        03/29/24 1419    doxycycline  (VIBRAMYCIN ) 100 MG capsule  2 times daily        03/29/24 1419              [name] 03/29/24 1444    Trish Furl, MD 03/29/24 [phone]

## 2024-03-29 NOTE — Discharge Instructions (Signed)
 Take Doxycycline  as prescribed and complete the full course.  If you develop a yeast infection, take the Diflucan .  If you have worsening eye infection, start the eye ointment and see the eye doctor.

## 2024-03-29 NOTE — ED Triage Notes (Signed)
 Pt reports concern for left lower eye lid swelling that started overnight. No eye drainage. No blurry vision.
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
## 2024-05-31 ENCOUNTER — Telehealth: Admitting: Physician Assistant

## 2024-05-31 DIAGNOSIS — B001 Herpesviral vesicular dermatitis: Secondary | ICD-10-CM

## 2024-05-31 MED ORDER — VALACYCLOVIR HCL 1 G PO TABS: 2000.0000 mg | ORAL_TABLET | Freq: Two times a day (BID) | ORAL | 0 refills | Status: AC

## 2024-05-31 NOTE — Progress Notes (Signed)

## 2024-07-02 ENCOUNTER — Other Ambulatory Visit: Payer: Self-pay | Admitting: Medical Genetics

## 2024-07-09 ENCOUNTER — Encounter: Payer: Self-pay | Admitting: Family Medicine

## 2024-07-09 ENCOUNTER — Ambulatory Visit (INDEPENDENT_AMBULATORY_CARE_PROVIDER_SITE_OTHER): Admitting: Family Medicine

## 2024-07-09 VITALS — BP 126/81 | HR 72 | Ht 69.0 in | Wt 297.0 lb

## 2024-07-09 DIAGNOSIS — Z1329 Encounter for screening for other suspected endocrine disorder: Secondary | ICD-10-CM

## 2024-07-09 DIAGNOSIS — Z13220 Encounter for screening for lipoid disorders: Secondary | ICD-10-CM | POA: Diagnosis not present

## 2024-07-09 DIAGNOSIS — Z130 Encounter for screening for diseases of the blood and blood-forming organs and certain disorders involving the immune mechanism: Secondary | ICD-10-CM

## 2024-07-09 DIAGNOSIS — Z13228 Encounter for screening for other metabolic disorders: Secondary | ICD-10-CM

## 2024-07-09 DIAGNOSIS — Z0000 Encounter for general adult medical examination without abnormal findings: Secondary | ICD-10-CM

## 2024-07-09 DIAGNOSIS — Z1159 Encounter for screening for other viral diseases: Secondary | ICD-10-CM
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
## 2024-07-10 ENCOUNTER — Encounter: Payer: Self-pay | Admitting: Family Medicine

## 2024-07-10 LAB — COMPREHENSIVE METABOLIC PANEL WITH GFR: EGFR: 100

## 2024-09-19 ENCOUNTER — Other Ambulatory Visit: Payer: Self-pay | Admitting: Medical Genetics

## 2024-09-19 DIAGNOSIS — Z006 Encounter for examination for normal comparison and control in clinical research program: Secondary | ICD-10-CM

## 2025-01-02 ENCOUNTER — Emergency Department (HOSPITAL_COMMUNITY)
Admission: EM | Admit: 2025-01-02 | Discharge: 2025-01-02 | Disposition: A | Discharge status: Home / Self Care | Attending: Emergency Medicine | Admitting: Emergency Medicine

## 2025-01-02 ENCOUNTER — Encounter (HOSPITAL_COMMUNITY): Payer: Self-pay | Admitting: *Deleted

## 2025-01-02 ENCOUNTER — Other Ambulatory Visit: Payer: Self-pay

## 2025-01-02 DIAGNOSIS — L02412 Cutaneous abscess of left axilla: Secondary | ICD-10-CM | POA: Insufficient documentation

## 2025-01-02 MED ORDER — SULFAMETHOXAZOLE-TRIMETHOPRIM 800-160 MG PO TABS: 1.0000 | ORAL_TABLET | Freq: Two times a day (BID) | ORAL | 0 refills | Status: AC

## 2025-01-02 MED ORDER — LIDOCAINE-EPINEPHRINE (PF) 2 %-1:200000 IJ SOLN
10.0000 mL | Freq: Once | INTRAMUSCULAR | Status: AC
  Administered 2025-01-02: 10 mL
  Filled 2025-01-02: qty 20

## 2025-01-02 MED ORDER — LIDOCAINE-EPINEPHRINE (PF) 2 %-1:200000 IJ SOLN
5.0000 mL | INTRAMUSCULAR | Status: DC
  Administered 2025-01-02: 5 mL

## 2025-01-02 MED ORDER — SULFAMETHOXAZOLE-TRIMETHOPRIM 800-160 MG PO TABS
1.0000 | ORAL_TABLET | Freq: Once | ORAL | Status: AC
  Administered 2025-01-02: 1 via ORAL
  Filled 2025-01-02: qty 1

## 2025-01-02 NOTE — ED Provider Notes (Signed)
 "  EMERGENCY DEPARTMENT AT [facility] Provider Note   CSN: [id number] Arrival date & time: 01/02/25  9375     Patient presents with: Abscess   Anne Ray is a 34 y.o. female.   Patient is here for evaluation of abscess in the left axillary region.  She first noticed this on Sunday after using Nair and shaving the area on Saturday.  On Monday she attempted to open the area however she was unable to secondary to pain.  She has used warm compresses without improvement.  She denies any drainage or bleeding from the site.  Denies fevers, shortness of breath, or chest pain.  The history is provided by the patient.  Abscess Location:  Shoulder/arm Shoulder/arm abscess location:  L axilla      Prior to Admission medications  Medication Sig Start Date End Date Taking? Authorizing Provider  sulfamethoxazole -trimethoprim  (BACTRIM  DS) 800-160 MG tablet Take 1 tablet by mouth 2 (two) times daily for 7 days. 01/02/25 01/09/25 Yes Rosina Almarie LABOR, PA-C  methylPREDNISolone  (MEDROL  DOSEPAK) 4 MG TBPK tablet 6 day dose pack - take as directed 04/12/23   Silva Juliene SAUNDERS, DPM    Allergies: Food and Tramadol    Review of Systems  Skin:        Abscess    Updated Vital Signs BP (!) 124/45 (BP Location: Right Arm)   Pulse 69   Temp 98.4 F (36.9 C) (Oral)   Resp 16   LMP 12/24/2024 (Approximate)   SpO2 99%   Physical Exam Vitals and nursing note reviewed.  Constitutional:      General: She is not in acute distress.    Appearance: Normal appearance. She is not ill-appearing, toxic-appearing or diaphoretic.  HENT:     Head: Normocephalic and atraumatic.     Nose: Nose normal.  Eyes:     General: No scleral icterus.    Extraocular Movements: Extraocular movements intact.     Conjunctiva/sclera: Conjunctivae normal.  Pulmonary:     Effort: Pulmonary effort is normal. No respiratory distress.  Musculoskeletal:        General: Normal range of motion.      Cervical back: Normal range of motion.  Skin:    General: Skin is warm and dry.     Coloration: Skin is not jaundiced or pale.     Comments: Abscess within the left axillary region, fluctuant and indurated with a small pustule that is non-draining. Pain with palpation.  Neurological:     Mental Status: She is alert and oriented to person, place, and time.     (all labs ordered are listed, but only abnormal results are displayed) Labs Reviewed - No data to display  EKG: None  Radiology: No results found.   .Incision and Drainage  Date/Time: 01/02/2025 7:23 AM  Performed by: Rosina Almarie LABOR, PA-C Authorized by: Rosina Almarie LABOR, PA-C   Consent:    Consent obtained:  Verbal   Consent given by:  Patient   Risks discussed:  Bleeding, incomplete drainage, pain and damage to other organs   Alternatives discussed:  No treatment Universal protocol:    Procedure explained and questions answered to patient or proxy's satisfaction: yes     Relevant documents present and verified: yes     Test results available : yes     Imaging studies available: yes     Required blood products, implants, devices, and special equipment available: yes     Site/side marked: yes  Immediately prior to procedure, a time out was called: yes     Patient identity confirmed:  Verbally with patient Location:    Type:  Abscess Pre-procedure details:    Skin preparation:  Betadine Anesthesia:    Anesthesia method:  Local infiltration   Local anesthetic:  5 mL lidocaine -EPINEPHrine  2 %-1:200000 Procedure type:    Complexity:  Simple Procedure details:    Ultrasound guidance: no     Needle aspiration: yes     Needle size:  22 G   Incision types:  Single straight   Incision depth:  Subcutaneous   Wound management:  Probed and deloculated   Drainage:  Purulent and bloody   Drainage amount:  Moderate   Wound treatment:  Wound left open Post-procedure details:    Procedure completion:   Tolerated    Medications Ordered in the ED  lidocaine -EPINEPHrine  (XYLOCAINE  W/EPI) 2 %-1:200000 (PF) injection 10 mL (10 mLs Infiltration Given by Other 01/02/25 0709)  sulfamethoxazole -trimethoprim  (BACTRIM  DS) 800-160 MG per tablet 1 tablet (1 tablet Oral Given 01/02/25 0730)     Patient presents to the ED for concern of abscess, this involves an extensive number of treatment options, and is a complaint that carries with it a high risk of complications and morbidity.  The differential diagnosis includes abscess, cyst, secondary infection.   Medicines ordered and prescription drug management:  I ordered medication including lidocaine  and bactrim  for I&D procedure.  Reevaluation of the patient after these medicines showed that the patient improved I have reviewed the patients home medicines and have made adjustments as needed   Problem List / ED Course:     Left axillary abscess.  Afebrile with reassuring vitals and no red flag symptoms.  I&D performed and antibiotic dose given in ED. Return precautions discussed and patient verbalized understanding. Stable for discharge.   Reevaluation:  After the interventions noted above, I reevaluated the patient and found that they have :improved   Dispostion:  After consideration of the diagnostic results and the patients response to treatment, I feel that the patent would benefit from supportive care in the home setting with prescription antibiotics and wound care as well as close follow-up with primary care physician.  Return precautions given.                                  Medical Decision Making Risk Prescription drug management.   This note was produced using [employer]. While the provider has reviewed and verified all clinical information, transcription errors may remain.     Final diagnoses:  Abscess of left axilla    ED Discharge Orders          Ordered    sulfamethoxazole -trimethoprim  (BACTRIM   DS) 800-160 MG tablet  2 times daily        01/02/25 0658               Rosina Almarie LABOR, PA-C 01/02/25 9263    Melvenia Motto, MD 01/02/25 [phone]  "

## 2025-01-02 NOTE — ED Triage Notes (Signed)
 Pt reports she noticed an abscess on Sunday (left axilla), she tried to cut it open with a blade on Monday. No drainage or fevers.

## 2025-01-02 NOTE — ED Notes (Signed)
 HCP, Ashley, PA-C, currently at bedside with patient

## 2025-01-02 NOTE — Discharge Instructions (Addendum)
 Please use Tylenol or ibuprofen for pain.  You may use 600 mg ibuprofen every 6 hours or 1000 mg of Tylenol every 6 hours.  You may choose to alternate between the 2.  This would be most effective.  Not to exceed 4 g of Tylenol within 24 hours.  Not to exceed 3200 mg ibuprofen 24 hours.

## 2025-07-10 ENCOUNTER — Encounter: Admitting: Family Medicine
